# Patient Record
Sex: Female | Born: 1970
Health system: Southern US, Community
[De-identification: ages and names within clinical notes are randomized; demographics above are authoritative.]

## PROBLEM LIST (undated history)

## (undated) DIAGNOSIS — R51 Headache: Secondary | ICD-10-CM

## (undated) DIAGNOSIS — T4145XA Adverse effect of unspecified anesthetic, initial encounter: Secondary | ICD-10-CM

## (undated) DIAGNOSIS — T8859XA Other complications of anesthesia, initial encounter: Secondary | ICD-10-CM

## (undated) DIAGNOSIS — F419 Anxiety disorder, unspecified: Secondary | ICD-10-CM

## (undated) DIAGNOSIS — F329 Major depressive disorder, single episode, unspecified: Secondary | ICD-10-CM

## (undated) DIAGNOSIS — F988 Other specified behavioral and emotional disorders with onset usually occurring in childhood and adolescence: Secondary | ICD-10-CM

## (undated) DIAGNOSIS — F32A Depression, unspecified: Secondary | ICD-10-CM

## (undated) DIAGNOSIS — I878 Other specified disorders of veins: Secondary | ICD-10-CM

## (undated) HISTORY — PX: WISDOM TOOTH EXTRACTION: SHX21

## (undated) HISTORY — PX: HERNIA REPAIR: SHX51

## (undated) HISTORY — DX: Depression, unspecified: F32.A

## (undated) HISTORY — DX: Major depressive disorder, single episode, unspecified: F32.9

## (undated) HISTORY — DX: Anxiety disorder, unspecified: F41.9

## (undated) HISTORY — PX: TONSILLECTOMY: SUR1361

---
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
## 1998-10-27 ENCOUNTER — Other Ambulatory Visit: Admission: RE | Admit: 1998-10-27 | Discharge: 1998-10-27 | Payer: Self-pay | Admitting: Obstetrics & Gynecology

## 2000-01-13 ENCOUNTER — Other Ambulatory Visit: Admission: RE | Admit: 2000-01-13 | Discharge: 2000-01-13 | Payer: Self-pay | Admitting: Obstetrics & Gynecology

## 2000-07-13 ENCOUNTER — Ambulatory Visit (HOSPITAL_COMMUNITY): Admission: RE | Admit: 2000-07-13 | Discharge: 2000-07-13 | Payer: Self-pay | Admitting: Internal Medicine

## 2000-07-13 ENCOUNTER — Encounter: Payer: Self-pay | Admitting: Internal Medicine

## 2003-02-22 ENCOUNTER — Other Ambulatory Visit: Admission: RE | Admit: 2003-02-22 | Discharge: 2003-02-22 | Payer: Self-pay | Admitting: Obstetrics and Gynecology

## 2003-05-13 ENCOUNTER — Encounter: Admission: RE | Admit: 2003-05-13 | Discharge: 2003-05-13 | Payer: Self-pay | Admitting: Internal Medicine

## 2004-03-09 ENCOUNTER — Other Ambulatory Visit: Admission: RE | Admit: 2004-03-09 | Discharge: 2004-03-09 | Payer: Self-pay | Admitting: Obstetrics and Gynecology

## 2006-09-22 ENCOUNTER — Inpatient Hospital Stay (HOSPITAL_COMMUNITY): Admission: RE | Admit: 2006-09-22 | Discharge: 2006-09-24 | Payer: Self-pay | Admitting: Obstetrics and Gynecology

## 2007-12-27 ENCOUNTER — Ambulatory Visit: Payer: Self-pay | Admitting: Family Medicine

## 2008-02-19 ENCOUNTER — Ambulatory Visit: Payer: Self-pay | Admitting: Family Medicine

## 2008-03-04 ENCOUNTER — Ambulatory Visit: Payer: Self-pay | Admitting: Family Medicine

## 2008-12-17 ENCOUNTER — Encounter: Admission: RE | Admit: 2008-12-17 | Discharge: 2008-12-17 | Payer: Self-pay | Admitting: Obstetrics & Gynecology

## 2008-12-26 ENCOUNTER — Inpatient Hospital Stay (HOSPITAL_COMMUNITY): Admission: AD | Admit: 2008-12-26 | Discharge: 2008-12-27 | Payer: Self-pay | Admitting: Obstetrics & Gynecology

## 2009-02-23 ENCOUNTER — Inpatient Hospital Stay (HOSPITAL_COMMUNITY): Admission: AD | Admit: 2009-02-23 | Discharge: 2009-02-26 | Payer: Self-pay | Admitting: Obstetrics & Gynecology

## 2009-03-05 ENCOUNTER — Ambulatory Visit: Payer: Self-pay | Admitting: Family Medicine

## 2009-03-31 ENCOUNTER — Encounter: Payer: Self-pay | Admitting: Cardiovascular Disease

## 2009-04-11 DIAGNOSIS — M79609 Pain in unspecified limb: Secondary | ICD-10-CM | POA: Insufficient documentation

## 2009-04-14 ENCOUNTER — Ambulatory Visit: Payer: Self-pay | Admitting: Cardiovascular Disease

## 2009-04-14 DIAGNOSIS — R0602 Shortness of breath: Secondary | ICD-10-CM

## 2009-04-30 ENCOUNTER — Encounter: Payer: Self-pay | Admitting: Cardiovascular Disease

## 2009-04-30 ENCOUNTER — Ambulatory Visit: Payer: Self-pay

## 2009-04-30 ENCOUNTER — Ambulatory Visit (HOSPITAL_COMMUNITY): Admission: RE | Admit: 2009-04-30 | Discharge: 2009-04-30 | Payer: Self-pay | Admitting: Cardiovascular Disease

## 2009-04-30 ENCOUNTER — Ambulatory Visit: Payer: Self-pay | Admitting: Cardiology

## 2009-06-24 ENCOUNTER — Ambulatory Visit: Payer: Self-pay | Admitting: Family Medicine

## 2010-01-03 ENCOUNTER — Ambulatory Visit: Payer: Self-pay | Admitting: Internal Medicine

## 2010-01-28 ENCOUNTER — Encounter (INDEPENDENT_AMBULATORY_CARE_PROVIDER_SITE_OTHER): Payer: Self-pay | Admitting: Family Medicine

## 2010-01-28 LAB — CONVERTED CEMR LAB
ALT: 18 units/L (ref 0–35)
AST: 19 units/L (ref 0–37)
Albumin: 4.5 g/dL (ref 3.5–5.2)
Alkaline Phosphatase: 50 units/L (ref 39–117)
BUN: 12 mg/dL (ref 6–23)
CO2: 23 meq/L (ref 19–32)
Calcium: 9.6 mg/dL (ref 8.4–10.5)
Chloride: 105 meq/L (ref 96–112)
Cholesterol: 174 mg/dL (ref 0–200)
Creatinine, Ser: 0.74 mg/dL (ref 0.40–1.20)
Glucose, Bld: 93 mg/dL (ref 70–99)
HDL: 58 mg/dL (ref >39)
LDL Cholesterol: 96 mg/dL (ref 0–99)
Potassium: 4.4 meq/L (ref 3.5–5.3)
Sodium: 140 meq/L (ref 135–145)
Total Bilirubin: 0.2 mg/dL — ABNORMAL LOW (ref 0.3–1.2)
Total CHOL/HDL Ratio: 3
Total Protein: 6.9 g/dL (ref 6.0–8.3)
Triglycerides: 102 mg/dL (ref <150)
VLDL: 20 mg/dL (ref 0–40)

## 2010-06-11 NOTE — Letter (Signed)
Summary: [facility]  [facility]   Imported By: Kassie Mends 07/18/2009 10:47:42  _____________________________________________________________________  External Attachment:    Type:   Image     Comment:   External Document

## 2010-08-13 LAB — CBC
HCT: 34 % — ABNORMAL LOW (ref 36.0–46.0)
HCT: 38.1 % (ref 36.0–46.0)
Hemoglobin: 11.3 g/dL — ABNORMAL LOW (ref 12.0–15.0)
Hemoglobin: 12.7 g/dL (ref 12.0–15.0)
MCHC: 33.3 g/dL (ref 30.0–36.0)
MCHC: 33.4 g/dL (ref 30.0–36.0)
MCV: 90.3 fL (ref 78.0–100.0)
MCV: 91.6 fL (ref 78.0–100.0)
Platelets: 204 10*3/uL (ref 150–400)
Platelets: 228 10*3/uL (ref 150–400)
RBC: 3.71 MIL/uL — ABNORMAL LOW (ref 3.87–5.11)
RBC: 4.21 MIL/uL (ref 3.87–5.11)
RDW: 14.9 % (ref 11.5–15.5)
RDW: 15.1 % (ref 11.5–15.5)
WBC: 8.7 10*3/uL (ref 4.0–10.5)
WBC: 9.2 10*3/uL (ref 4.0–10.5)

## 2010-08-13 LAB — RPR: RPR Ser Ql: NONREACTIVE

## 2010-08-15 LAB — COMPREHENSIVE METABOLIC PANEL
ALT: 22 U/L (ref 0–35)
Alkaline Phosphatase: 62 U/L (ref 39–117)
BUN: 5 mg/dL — ABNORMAL LOW (ref 6–23)
CO2: 21 mEq/L (ref 19–32)
Chloride: 108 mEq/L (ref 96–112)
Glucose, Bld: 84 mg/dL (ref 70–99)
Potassium: 4.6 mEq/L (ref 3.5–5.1)
Sodium: 137 mEq/L (ref 135–145)
Total Bilirubin: 0.7 mg/dL (ref 0.3–1.2)
Total Protein: 6.1 g/dL (ref 6.0–8.3)

## 2010-08-15 LAB — URINE CULTURE

## 2010-08-15 LAB — CBC
HCT: 37.9 % (ref 36.0–46.0)
Hemoglobin: 12.6 g/dL (ref 12.0–15.0)
RBC: 4.16 MIL/uL (ref 3.87–5.11)
RDW: 15.5 % (ref 11.5–15.5)
WBC: 7.3 10*3/uL (ref 4.0–10.5)

## 2010-08-15 LAB — URINALYSIS, ROUTINE W REFLEX MICROSCOPIC
Glucose, UA: NEGATIVE mg/dL
Hgb urine dipstick: NEGATIVE
Ketones, ur: >80 mg/dL — AB
Protein, ur: NEGATIVE mg/dL
pH: 6 (ref 5.0–8.0)

## 2010-08-15 LAB — URINE MICROSCOPIC-ADD ON: RBC / HPF: NONE SEEN RBC/hpf (ref <3)

## 2010-09-25 NOTE — Discharge Summary (Signed)
NAME:  Cindy Deleon, Cindy Deleon                 ACCOUNT NO.:  [id number]   MEDICAL RECORD NO.:  [id number]          PATIENT TYPE:  INP   LOCATION:  9122                          FACILITY:  [facility]   PHYSICIAN:  Gerrit Friends. Aldona Bar, M.D.   DATE OF BIRTH:  1971-01-15   DATE OF ADMISSION:  09/22/2006  DATE OF DISCHARGE:                               DISCHARGE SUMMARY   DISCHARGE DIAGNOSES:  1. Term pregnancy, delivered 8-pound 10-ounce female infant, Apgars 9      and 9.  2. Blood type A+.   PROCEDURES:  1. Normal spontaneous delivery.  2. Second-degree tear and repair.   SUMMARY:  This 40 year old, gravida 2, para 1 was admitted at 78 to 76  weeks' gestation with spontaneous rupture of membranes.  During the  latter part of her pregnancy, she had significant lower extremity edema  but normal blood pressures and normal labs.   At the time of admission, she was about 2 cm dilated, 70% effaced with  vertex at -2 station.  She was admitted.  Labor was augmented with  Pitocin.  She eventually requested and received an epidural, and she  progressed and had a normal spontaneous delivery of an 8-pound 10-ounce  female infant with Apgars of 9 and 9 over a second-degree tear which was  repaired without difficulty.  Postpartum course was uncomplicated.  Her  edema was slightly improved at the time of discharge.  Her discharge  hemoglobin was 9.8 with a white count 10,200 and a platelet count of  136,000.   On the morning of May 17, she was ambulating well, tolerating a regular  diet well, having normal bowel and bladder function.  Vital signs were  stable.  She was bottle feeding without difficulty and was desirous of  discharge.  As mentioned, she still had significant lower extremity  edema, but her blood pressure was normal.   Discharge medications include hydrochlorothiazide 25 mg in the morning  for the next 3 or 4 days or as needed for fluid retention, Motrin 600 mg  every 6 hours as needed for cramping  or pain, and Tylox 1 to 2 every 4  to 6 hours as needed for more severe pain.  She will also use Feosol  capsules - 1 a day - until her return to the office in approximately  four weeks' time.  She will finish up her prenatal vitamins.   She was given all appropriate instructions per discharge brochure at the  time of discharge and understood all instructions well.   CONDITION ON DISCHARGE:  Improved.     Gerrit Friends. Aldona Bar, M.D.  Electronically Signed    RMW/MEDQ  D:  09/24/2006  T:  09/24/2006  Job:  [id number]

## 2010-10-03 ENCOUNTER — Inpatient Hospital Stay (INDEPENDENT_AMBULATORY_CARE_PROVIDER_SITE_OTHER)
Admission: RE | Admit: 2010-10-03 | Discharge: 2010-10-03 | Disposition: A | Discharge status: Home / Self Care | Payer: Self-pay | Source: Ambulatory Visit | Attending: Family Medicine | Admitting: Family Medicine

## 2010-10-03 DIAGNOSIS — F411 Generalized anxiety disorder: Secondary | ICD-10-CM

## 2010-10-03 DIAGNOSIS — J019 Acute sinusitis, unspecified: Secondary | ICD-10-CM

## 2010-10-03 DIAGNOSIS — F329 Major depressive disorder, single episode, unspecified: Secondary | ICD-10-CM

## 2010-10-03 DIAGNOSIS — R229 Localized swelling, mass and lump, unspecified: Secondary | ICD-10-CM

## 2010-10-10 ENCOUNTER — Emergency Department (HOSPITAL_COMMUNITY)
Admission: EM | Admit: 2010-10-10 | Discharge: 2010-10-11 | Disposition: A | Discharge status: Home / Self Care | Payer: Self-pay | Attending: Emergency Medicine | Admitting: Emergency Medicine

## 2010-10-10 DIAGNOSIS — F3289 Other specified depressive episodes: Secondary | ICD-10-CM | POA: Insufficient documentation

## 2010-10-10 DIAGNOSIS — R42 Dizziness and giddiness: Secondary | ICD-10-CM | POA: Insufficient documentation

## 2010-10-10 DIAGNOSIS — R110 Nausea: Secondary | ICD-10-CM | POA: Insufficient documentation

## 2010-10-10 DIAGNOSIS — G43909 Migraine, unspecified, not intractable, without status migrainosus: Secondary | ICD-10-CM | POA: Insufficient documentation

## 2010-10-10 DIAGNOSIS — M129 Arthropathy, unspecified: Secondary | ICD-10-CM | POA: Insufficient documentation

## 2010-10-10 DIAGNOSIS — H5710 Ocular pain, unspecified eye: Secondary | ICD-10-CM | POA: Insufficient documentation

## 2010-10-10 DIAGNOSIS — F329 Major depressive disorder, single episode, unspecified: Secondary | ICD-10-CM | POA: Insufficient documentation

## 2010-10-10 DIAGNOSIS — H53149 Visual discomfort, unspecified: Secondary | ICD-10-CM | POA: Insufficient documentation

## 2010-10-11 ENCOUNTER — Emergency Department (HOSPITAL_COMMUNITY): Payer: Self-pay

## 2011-03-08 ENCOUNTER — Encounter: Payer: Self-pay | Admitting: Family Medicine

## 2013-03-26 ENCOUNTER — Encounter (INDEPENDENT_AMBULATORY_CARE_PROVIDER_SITE_OTHER): Payer: Self-pay | Admitting: Surgery

## 2013-03-26 ENCOUNTER — Ambulatory Visit (INDEPENDENT_AMBULATORY_CARE_PROVIDER_SITE_OTHER): Payer: Self-pay | Admitting: Surgery

## 2013-03-26 ENCOUNTER — Telehealth (INDEPENDENT_AMBULATORY_CARE_PROVIDER_SITE_OTHER): Payer: Self-pay | Admitting: Surgery

## 2013-03-26 VITALS — BP 118/70 | HR 96 | Temp 98.1°F | Resp 15 | Ht 67.0 in | Wt 135.8 lb

## 2013-03-26 DIAGNOSIS — K4090 Unilateral inguinal hernia, without obstruction or gangrene, not specified as recurrent: Secondary | ICD-10-CM | POA: Insufficient documentation

## 2013-03-26 DIAGNOSIS — R109 Unspecified abdominal pain: Secondary | ICD-10-CM | POA: Insufficient documentation

## 2013-03-26 NOTE — Progress Notes (Signed)
Patient ID: Cindy Deleon, female   DOB: 04/23/1971, 42 y.o.   MRN: [id number]  Chief Complaint  Patient presents with  . New Evaluation    eval left ing hernia    HPI Cindy Deleon is Deleon 42 y.o. female.  Patient presents at request of Dr.Wein for left inguinal hernia. The patient has noticed Deleon bulge at least the last year. She also complains of progressive abdominal distention, vague discomfort and change in bowel function becoming more constipated. No blood in her stool. Family history of breast and colon cancer. She states she feels more bloated and that her abdomen is become larger. The hernias more noticeable her left groin she stands up or strains. It slides in slides out easily. It causes mild to moderate discomfort. HPI  Past Medical History  Diagnosis Date  . Depression   . Anxiety     Past Surgical History  Procedure Laterality Date  . Tonsillectomy      Family History  Problem Relation Age of Onset  . Cancer Maternal Grandmother     breast  . Cancer Paternal Grandfather     colon and lung    Social History History  Substance Use Topics  . Smoking status: Current Some Day Smoker -- 0.25 packs/day  . Smokeless tobacco: Never Used  . Alcohol Use: No    Allergies  Allergen Reactions  . Sulfonamide Derivatives     Current Outpatient Prescriptions  Medication Sig Dispense Refill  . amphetamine-dextroamphetamine (ADDERALL XR) 10 MG 24 hr capsule Take 10 mg by mouth daily.      . clonazePAM (KLONOPIN) 0.5 MG tablet Take 0.5 mg by mouth 2 (two) times daily as needed for anxiety.      . ibuprofen (ADVIL,MOTRIN) 800 MG tablet Take 800 mg by mouth every 8 (eight) hours as needed.      . rizatriptan (MAXALT) 10 MG tablet Take 10 mg by mouth as needed for migraine. May repeat in 2 hours if needed      . sertraline (ZOLOFT) 100 MG tablet Take 150 mg by mouth daily.       No current facility-administered medications for this visit.    Review of Systems Review of  Systems  Constitutional: Positive for fatigue. Negative for fever and unexpected weight change.  Eyes: Negative.   Respiratory: Negative.   Cardiovascular: Negative.   Gastrointestinal: Positive for abdominal pain, constipation and abdominal distention.  Endocrine: Negative.   Genitourinary: Negative.   Allergic/Immunologic: Negative.   Neurological: Negative.   Hematological: Negative.   Psychiatric/Behavioral: Negative.     Blood pressure 118/70, pulse 96, temperature 98.1 F (36.7 C), temperature source Temporal, resp. rate 15, height 5' 7" (1.702 m), weight 135 lb 12.8 oz (61.598 kg).  Physical Exam Physical Exam  Constitutional: She is oriented to person, place, and time. She appears well-developed and well-nourished.  HENT:  Head: Normocephalic and atraumatic.  Eyes: EOM are normal. Pupils are equal, round, and reactive to light.  Neck: Normal range of motion. Neck supple.  Cardiovascular: Normal rate.   Pulmonary/Chest: Effort normal.  Abdominal: Soft. She exhibits distension. She exhibits no mass. There is no tenderness. There is no rebound and no guarding. Deleon hernia is present. Hernia confirmed positive in the left inguinal area.  Musculoskeletal: Normal range of motion.  Neurological: She is alert and oriented to person, place, and time.  Skin: Skin is warm.  Psychiatric: She has Deleon normal mood and affect. Her behavior is normal. Judgment and thought   content normal.    Data Reviewed none  Assessment    Left inguinal hernia reducible  Abdominal pain and bloating with change in bowel function    Plan    Recommend CT scan abdomen pelvis to evaluate progressive abdominal pain and bloating. Recommend repair of left inguinal hernia with mesh.The risk of hernia repair include bleeding,  Infection,   Recurrence of the hernia,  Mesh use, chronic pain,  Organ injury,  Bowel injury,  Bladder injury,   nerve injury with numbness around the incision,  Death,  and worsening of  preexisting  medical problems.  The alternatives to surgery have been discussed as well..  Long term expectations of both operative and non operative treatments have been discussed.   The patient agrees to proceed.       Cindy Deleon. 03/26/2013, 12:30 PM    

## 2013-03-26 NOTE — Telephone Encounter (Signed)
Patient met with surgery scheduling, gave financial responsibilities, patient will call back to schedule, face placed in pending

## 2013-03-26 NOTE — Patient Instructions (Signed)

## 2013-03-27 ENCOUNTER — Ambulatory Visit
Admission: RE | Admit: 2013-03-27 | Discharge: 2013-03-27 | Disposition: A | Discharge status: Home / Self Care | Payer: Self-pay | Source: Ambulatory Visit | Attending: Surgery | Admitting: Surgery

## 2013-03-27 DIAGNOSIS — R109 Unspecified abdominal pain: Secondary | ICD-10-CM

## 2013-03-27 MED ORDER — IOHEXOL 300 MG/ML  SOLN
100.0000 mL | Freq: Once | INTRAMUSCULAR | Status: AC | PRN
  Administered 2013-03-27: 100 mL via INTRAVENOUS

## 2013-04-03 ENCOUNTER — Telehealth (INDEPENDENT_AMBULATORY_CARE_PROVIDER_SITE_OTHER): Payer: Self-pay

## 2013-04-03 NOTE — Telephone Encounter (Signed)
The pt called stating she has surgery scheduled for 12/4.  She is unemployed and has had job interviews.  She has a job offer and they want her to start on 12/8.  She wants to know if this would be possible.   She also wants her CT results.   Please call

## 2013-04-04 NOTE — Telephone Encounter (Signed)
Called pt back and went over her CT results with her. I went over high fiber diet for her to follow.  I also explained starting a new job 4 days after surgery was not likely possible. Explained she would be restricted from lifting for 4-6 weeks after surgery. Pt understood information given to her.

## 2013-04-09 ENCOUNTER — Encounter (HOSPITAL_BASED_OUTPATIENT_CLINIC_OR_DEPARTMENT_OTHER): Payer: Self-pay | Admitting: *Deleted

## 2013-04-09 NOTE — Progress Notes (Signed)
To come in for [facility] labs- 

## 2013-04-10 ENCOUNTER — Encounter (HOSPITAL_BASED_OUTPATIENT_CLINIC_OR_DEPARTMENT_OTHER)
Admission: RE | Admit: 2013-04-10 | Discharge: 2013-04-10 | Disposition: A | Discharge status: Home / Self Care | Payer: Self-pay | Source: Ambulatory Visit | Attending: Surgery | Admitting: Surgery

## 2013-04-10 DIAGNOSIS — Z01810 Encounter for preprocedural cardiovascular examination: Secondary | ICD-10-CM | POA: Insufficient documentation

## 2013-04-10 DIAGNOSIS — Z01818 Encounter for other preprocedural examination: Secondary | ICD-10-CM | POA: Insufficient documentation

## 2013-04-10 LAB — COMPREHENSIVE METABOLIC PANEL
ALT: 24 U/L (ref 0–35)
BUN: 11 mg/dL (ref 6–23)
CO2: 30 mEq/L (ref 19–32)
Calcium: 9.9 mg/dL (ref 8.4–10.5)
GFR calc Af Amer: >90 mL/min (ref >90)
GFR calc non Af Amer: >90 mL/min (ref >90)
Glucose, Bld: 76 mg/dL (ref 70–99)
Potassium: 4.1 mEq/L (ref 3.5–5.1)
Sodium: 140 mEq/L (ref 135–145)
Total Protein: 7.7 g/dL (ref 6.0–8.3)

## 2013-04-10 LAB — CBC WITH DIFFERENTIAL/PLATELET
Basophils Relative: 0 % (ref 0–1)
Eosinophils Absolute: 0 10*3/uL (ref 0.0–0.7)
Eosinophils Relative: 1 % (ref 0–5)
Lymphs Abs: 1.8 10*3/uL (ref 0.7–4.0)
MCH: 31.2 pg (ref 26.0–34.0)
MCHC: 34.9 g/dL (ref 30.0–36.0)
MCV: 89.4 fL (ref 78.0–100.0)
Neutro Abs: 5.5 10*3/uL (ref 1.7–7.7)
Neutrophils Relative %: 72 % (ref 43–77)
Platelets: 185 10*3/uL (ref 150–400)
RBC: 4.81 MIL/uL (ref 3.87–5.11)

## 2013-04-12 ENCOUNTER — Ambulatory Visit (HOSPITAL_BASED_OUTPATIENT_CLINIC_OR_DEPARTMENT_OTHER): Payer: Self-pay | Admitting: *Deleted

## 2013-04-12 ENCOUNTER — Encounter (HOSPITAL_BASED_OUTPATIENT_CLINIC_OR_DEPARTMENT_OTHER): Payer: Self-pay | Admitting: *Deleted

## 2013-04-12 ENCOUNTER — Ambulatory Visit (HOSPITAL_BASED_OUTPATIENT_CLINIC_OR_DEPARTMENT_OTHER)
Admission: RE | Admit: 2013-04-12 | Discharge: 2013-04-12 | Disposition: A | Discharge status: Home / Self Care | Payer: Self-pay | Source: Ambulatory Visit | Attending: Surgery | Admitting: Surgery

## 2013-04-12 ENCOUNTER — Encounter (HOSPITAL_BASED_OUTPATIENT_CLINIC_OR_DEPARTMENT_OTHER)
Admission: RE | Disposition: A | Discharge status: Home / Self Care | Payer: Self-pay | Source: Ambulatory Visit | Attending: Surgery

## 2013-04-12 DIAGNOSIS — M79609 Pain in unspecified limb: Secondary | ICD-10-CM

## 2013-04-12 DIAGNOSIS — F17200 Nicotine dependence, unspecified, uncomplicated: Secondary | ICD-10-CM | POA: Insufficient documentation

## 2013-04-12 DIAGNOSIS — K4090 Unilateral inguinal hernia, without obstruction or gangrene, not specified as recurrent: Secondary | ICD-10-CM

## 2013-04-12 DIAGNOSIS — Z79899 Other long term (current) drug therapy: Secondary | ICD-10-CM | POA: Insufficient documentation

## 2013-04-12 DIAGNOSIS — F329 Major depressive disorder, single episode, unspecified: Secondary | ICD-10-CM | POA: Insufficient documentation

## 2013-04-12 DIAGNOSIS — F411 Generalized anxiety disorder: Secondary | ICD-10-CM | POA: Insufficient documentation

## 2013-04-12 DIAGNOSIS — F3289 Other specified depressive episodes: Secondary | ICD-10-CM | POA: Insufficient documentation

## 2013-04-12 DIAGNOSIS — R0602 Shortness of breath: Secondary | ICD-10-CM

## 2013-04-12 DIAGNOSIS — Z01812 Encounter for preprocedural laboratory examination: Secondary | ICD-10-CM | POA: Insufficient documentation

## 2013-04-12 DIAGNOSIS — R109 Unspecified abdominal pain: Secondary | ICD-10-CM

## 2013-04-12 HISTORY — PX: INGUINAL HERNIA REPAIR: SHX194

## 2013-04-12 HISTORY — DX: Other specified behavioral and emotional disorders with onset usually occurring in childhood and adolescence: F98.8

## 2013-04-12 HISTORY — DX: Headache: R51

## 2013-04-12 HISTORY — DX: Other specified disorders of veins: I87.8

## 2013-04-12 SURGERY — REPAIR, HERNIA, INGUINAL, ADULT
Anesthesia: General | Site: Abdomen | Laterality: Left

## 2013-04-12 MED ORDER — SUCCINYLCHOLINE CHLORIDE 20 MG/ML IJ SOLN
INTRAMUSCULAR | Status: DC | PRN
  Administered 2013-04-12: 100 mg via INTRAVENOUS

## 2013-04-12 MED ORDER — BUPIVACAINE-EPINEPHRINE 0.25% -1:200000 IJ SOLN
INTRAMUSCULAR | Status: DC | PRN
  Administered 2013-04-12: 30 mL

## 2013-04-12 MED ORDER — OXYCODONE HCL 5 MG PO TABS: 5.0000 mg | ORAL_TABLET | Freq: Once | ORAL | Status: DC | PRN

## 2013-04-12 MED ORDER — OXYCODONE-ACETAMINOPHEN 5-325 MG PO TABS: 1.0000 | ORAL_TABLET | ORAL | Status: DC | PRN

## 2013-04-12 MED ORDER — TRAMADOL HCL 50 MG PO TABS: 100.0000 mg | ORAL_TABLET | Freq: Four times a day (QID) | ORAL | Status: DC | PRN

## 2013-04-12 MED ORDER — LACTATED RINGERS IV SOLN
INTRAVENOUS | Status: DC
  Administered 2013-04-12 (×2): via INTRAVENOUS

## 2013-04-12 MED ORDER — LIDOCAINE HCL (CARDIAC) 20 MG/ML IV SOLN
INTRAVENOUS | Status: DC | PRN
  Administered 2013-04-12: 100 mg via INTRAVENOUS

## 2013-04-12 MED ORDER — PROPOFOL 10 MG/ML IV EMUL
INTRAVENOUS | Status: AC
  Filled 2013-04-12: qty 50

## 2013-04-12 MED ORDER — BUPIVACAINE-EPINEPHRINE PF 0.25-1:200000 % IJ SOLN
INTRAMUSCULAR | Status: AC
  Filled 2013-04-12: qty 30

## 2013-04-12 MED ORDER — MIDAZOLAM HCL 5 MG/5ML IJ SOLN
INTRAMUSCULAR | Status: DC | PRN
  Administered 2013-04-12: 2 mg via INTRAVENOUS

## 2013-04-12 MED ORDER — DEXTROSE 5 % IV SOLN
3.0000 g | INTRAVENOUS | Status: AC
  Administered 2013-04-12: 2 g via INTRAVENOUS

## 2013-04-12 MED ORDER — FENTANYL CITRATE 0.05 MG/ML IJ SOLN: 50.0000 ug | INTRAMUSCULAR | Status: DC | PRN

## 2013-04-12 MED ORDER — MIDAZOLAM HCL 2 MG/2ML IJ SOLN
INTRAMUSCULAR | Status: AC
  Filled 2013-04-12: qty 2

## 2013-04-12 MED ORDER — SUCCINYLCHOLINE CHLORIDE 20 MG/ML IJ SOLN
INTRAMUSCULAR | Status: AC
  Filled 2013-04-12: qty 2

## 2013-04-12 MED ORDER — CHLORHEXIDINE GLUCONATE 4 % EX LIQD: 1.0000 "application " | Freq: Once | CUTANEOUS | Status: DC

## 2013-04-12 MED ORDER — DEXTROSE 5 % IV SOLN
10.0000 mg | INTRAVENOUS | Status: DC | PRN
  Administered 2013-04-12: 50 ug/min via INTRAVENOUS

## 2013-04-12 MED ORDER — PROPOFOL 10 MG/ML IV BOLUS
INTRAVENOUS | Status: DC | PRN
  Administered 2013-04-12: 200 mg via INTRAVENOUS

## 2013-04-12 MED ORDER — EPHEDRINE SULFATE 50 MG/ML IJ SOLN
INTRAMUSCULAR | Status: DC | PRN
  Administered 2013-04-12: 10 mg via INTRAVENOUS
  Administered 2013-04-12 (×2): 5 mg via INTRAVENOUS

## 2013-04-12 MED ORDER — ONDANSETRON HCL 4 MG/2ML IJ SOLN
INTRAMUSCULAR | Status: DC | PRN
  Administered 2013-04-12: 4 mg via INTRAVENOUS

## 2013-04-12 MED ORDER — DEXAMETHASONE SODIUM PHOSPHATE 4 MG/ML IJ SOLN
INTRAMUSCULAR | Status: DC | PRN
  Administered 2013-04-12: 10 mg via INTRAVENOUS

## 2013-04-12 MED ORDER — MIDAZOLAM HCL 2 MG/2ML IJ SOLN: 1.0000 mg | INTRAMUSCULAR | Status: DC | PRN

## 2013-04-12 MED ORDER — HYDROMORPHONE HCL PF 1 MG/ML IJ SOLN: 0.2500 mg | INTRAMUSCULAR | Status: DC | PRN

## 2013-04-12 MED ORDER — ONDANSETRON HCL 4 MG/2ML IJ SOLN: 4.0000 mg | Freq: Once | INTRAMUSCULAR | Status: DC | PRN

## 2013-04-12 MED ORDER — FENTANYL CITRATE 0.05 MG/ML IJ SOLN
INTRAMUSCULAR | Status: DC | PRN
  Administered 2013-04-12: 100 ug via INTRAVENOUS

## 2013-04-12 MED ORDER — FENTANYL CITRATE 0.05 MG/ML IJ SOLN
INTRAMUSCULAR | Status: AC
  Filled 2013-04-12: qty 4

## 2013-04-12 MED ORDER — OXYCODONE HCL 5 MG/5ML PO SOLN
5.0000 mg | Freq: Once | ORAL | Status: DC | PRN
Start: 2013-04-12 — End: 2013-04-12

## 2013-04-12 SURGICAL SUPPLY — 49 items
BLADE SURG 15 STRL LF DISP TIS (BLADE) ×1 IMPLANT
BLADE SURG 15 STRL SS (BLADE) ×1
BLADE SURG ROTATE 9660 (MISCELLANEOUS) ×2 IMPLANT
CANISTER SUCT 1200ML W/VALVE (MISCELLANEOUS) ×2 IMPLANT
CHLORAPREP W/TINT 26ML (MISCELLANEOUS) ×2 IMPLANT
COVER MAYO STAND STRL (DRAPES) ×2 IMPLANT
COVER TABLE BACK 60X90 (DRAPES) ×2 IMPLANT
DECANTER SPIKE VIAL GLASS SM (MISCELLANEOUS) ×2 IMPLANT
DERMABOND ADVANCED (GAUZE/BANDAGES/DRESSINGS) ×1
DERMABOND ADVANCED .7 DNX12 (GAUZE/BANDAGES/DRESSINGS) ×1 IMPLANT
DRAIN PENROSE 1/2X12 LTX STRL (WOUND CARE) ×2 IMPLANT
DRAPE LAPAROTOMY TRNSV 102X78 (DRAPE) ×2 IMPLANT
DRAPE UTILITY XL STRL (DRAPES) ×2 IMPLANT
ELECT COATED BLADE 2.86 ST (ELECTRODE) ×2 IMPLANT
ELECT REM PT RETURN 9FT ADLT (ELECTROSURGICAL) ×2
ELECTRODE REM PT RTRN 9FT ADLT (ELECTROSURGICAL) ×1 IMPLANT
GAUZE SPONGE 4X4 12PLY STRL LF (GAUZE/BANDAGES/DRESSINGS) IMPLANT
GAUZE SPONGE 4X4 16PLY XRAY LF (GAUZE/BANDAGES/DRESSINGS) IMPLANT
GLOVE BIOGEL M STRL SZ7.5 (GLOVE) ×2 IMPLANT
GLOVE BIOGEL PI IND STRL 8 (GLOVE) ×2 IMPLANT
GLOVE BIOGEL PI INDICATOR 8 (GLOVE) ×2
GLOVE ECLIPSE 8.0 STRL XLNG CF (GLOVE) ×2 IMPLANT
GLOVE EXAM NITRILE LRG STRL (GLOVE) ×2 IMPLANT
GOWN PREVENTION PLUS XLARGE (GOWN DISPOSABLE) ×2 IMPLANT
GOWN STRL REIN 2XL LVL4 (GOWN DISPOSABLE) ×2 IMPLANT
MESH HERNIA SYS ULTRAPRO LRG (Mesh General) ×2 IMPLANT
NEEDLE HYPO 25X1 1.5 SAFETY (NEEDLE) ×2 IMPLANT
NS IRRIG 1000ML POUR BTL (IV SOLUTION) ×2 IMPLANT
PACK BASIN DAY SURGERY FS (CUSTOM PROCEDURE TRAY) ×2 IMPLANT
PENCIL BUTTON HOLSTER BLD 10FT (ELECTRODE) ×2 IMPLANT
SLEEVE SCD COMPRESS KNEE MED (MISCELLANEOUS) ×2 IMPLANT
SPONGE LAP 4X18 X RAY DECT (DISPOSABLE) ×2 IMPLANT
STAPLER VISISTAT 35W (STAPLE) IMPLANT
SUT MON AB 4-0 PC3 18 (SUTURE) ×2 IMPLANT
SUT NOVA 0 T19/GS 22DT (SUTURE) ×4 IMPLANT
SUT VIC AB 0 SH 27 (SUTURE) ×2 IMPLANT
SUT VIC AB 2-0 SH 27 (SUTURE) ×1
SUT VIC AB 2-0 SH 27XBRD (SUTURE) ×1 IMPLANT
SUT VIC AB 3-0 54X BRD REEL (SUTURE) IMPLANT
SUT VIC AB 3-0 BRD 54 (SUTURE)
SUT VICRYL 3-0 CR8 SH (SUTURE) ×2 IMPLANT
SUT VICRYL AB 2 0 TIE (SUTURE) IMPLANT
SUT VICRYL AB 2 0 TIES (SUTURE)
SYR CONTROL 10ML LL (SYRINGE) ×2 IMPLANT
TAPE HYPAFIX 4 X10 (GAUZE/BANDAGES/DRESSINGS) IMPLANT
TOWEL OR 17X24 6PK STRL BLUE (TOWEL DISPOSABLE) ×4 IMPLANT
TOWEL OR NON WOVEN STRL DISP B (DISPOSABLE) ×2 IMPLANT
TUBE CONNECTING 20X1/4 (TUBING) ×2 IMPLANT
YANKAUER SUCT BULB TIP NO VENT (SUCTIONS) ×2 IMPLANT

## 2013-04-12 NOTE — H&P (View-Only) (Signed)
Patient ID: Cindy Deleon, female   DOB: 09-Jun-1970, 42 y.o.   MRN: [id number]  Chief Complaint  Patient presents with  . New Evaluation    eval left ing hernia    HPI Cindy Deleon is a 42 y.o. female.  Patient presents at request of Dr.Wein for left inguinal hernia. The patient has noticed a bulge at least the last year. She also complains of progressive abdominal distention, vague discomfort and change in bowel function becoming more constipated. No blood in her stool. Family history of breast and colon cancer. She states she feels more bloated and that her abdomen is become larger. The hernias more noticeable her left groin she stands up or strains. It slides in slides out easily. It causes mild to moderate discomfort. HPI  Past Medical History  Diagnosis Date  . Depression   . Anxiety     Past Surgical History  Procedure Laterality Date  . Tonsillectomy      Family History  Problem Relation Age of Onset  . Cancer Maternal Grandmother     breast  . Cancer Paternal Grandfather     colon and lung    Social History History  Substance Use Topics  . Smoking status: Current Some Day Smoker -- 0.25 packs/day  . Smokeless tobacco: Never Used  . Alcohol Use: No    Allergies  Allergen Reactions  . Sulfonamide Derivatives     Current Outpatient Prescriptions  Medication Sig Dispense Refill  . amphetamine-dextroamphetamine (ADDERALL XR) 10 MG 24 hr capsule Take 10 mg by mouth daily.      . clonazePAM (KLONOPIN) 0.5 MG tablet Take 0.5 mg by mouth 2 (two) times daily as needed for anxiety.      Marland Kitchen ibuprofen (ADVIL,MOTRIN) 800 MG tablet Take 800 mg by mouth every 8 (eight) hours as needed.      . rizatriptan (MAXALT) 10 MG tablet Take 10 mg by mouth as needed for migraine. May repeat in 2 hours if needed      . sertraline (ZOLOFT) 100 MG tablet Take 150 mg by mouth daily.       No current facility-administered medications for this visit.    Review of Systems Review of  Systems  Constitutional: Positive for fatigue. Negative for fever and unexpected weight change.  Eyes: Negative.   Respiratory: Negative.   Cardiovascular: Negative.   Gastrointestinal: Positive for abdominal pain, constipation and abdominal distention.  Endocrine: Negative.   Genitourinary: Negative.   Allergic/Immunologic: Negative.   Neurological: Negative.   Hematological: Negative.   Psychiatric/Behavioral: Negative.     Blood pressure 118/70, pulse 96, temperature 98.1 F (36.7 C), temperature source Temporal, resp. rate 15, height 5\' 7"  (1.702 m), weight 135 lb 12.8 oz (61.598 kg).  Physical Exam Physical Exam  Constitutional: She is oriented to person, place, and time. She appears well-developed and well-nourished.  HENT:  Head: Normocephalic and atraumatic.  Eyes: EOM are normal. Pupils are equal, round, and reactive to light.  Neck: Normal range of motion. Neck supple.  Cardiovascular: Normal rate.   Pulmonary/Chest: Effort normal.  Abdominal: Soft. She exhibits distension. She exhibits no mass. There is no tenderness. There is no rebound and no guarding. A hernia is present. Hernia confirmed positive in the left inguinal area.  Musculoskeletal: Normal range of motion.  Neurological: She is alert and oriented to person, place, and time.  Skin: Skin is warm.  Psychiatric: She has a normal mood and affect. Her behavior is normal. Judgment and thought  content normal.    Data Reviewed none  Assessment    Left inguinal hernia reducible  Abdominal pain and bloating with change in bowel function    Plan    Recommend CT scan abdomen pelvis to evaluate progressive abdominal pain and bloating. Recommend repair of left inguinal hernia with mesh.The risk of hernia repair include bleeding,  Infection,   Recurrence of the hernia,  Mesh use, chronic pain,  Organ injury,  Bowel injury,  Bladder injury,   nerve injury with numbness around the incision,  Death,  and worsening of  preexisting  medical problems.  The alternatives to surgery have been discussed as well..  Long term expectations of both operative and non operative treatments have been discussed.   The patient agrees to proceed.       Adelae Yodice A. 03/26/2013, 12:30 PM

## 2013-04-12 NOTE — Anesthesia Procedure Notes (Signed)
Procedure Name: Intubation Date/Time: 04/12/2013 7:37 AM Performed by: Harriette Bouillon A. Pre-anesthesia Checklist: Patient identified, Emergency Drugs available, Suction available and Patient being monitored Patient Re-evaluated:Patient Re-evaluated prior to inductionOxygen Delivery Method: Circle System Utilized Preoxygenation: Pre-oxygenation with 100% oxygen Intubation Type: IV induction Ventilation: Mask ventilation without difficulty Laryngoscope Size: Miller and 2 Grade View: Grade I Tube type: Oral Tube size: 7.0 mm Number of attempts: 1 Airway Equipment and Method: stylet Placement Confirmation: ETT inserted through vocal cords under direct vision,  positive ETCO2 and breath sounds checked- equal and bilateral Secured at: 22 cm Tube secured with: Tape Dental Injury: Teeth and Oropharynx as per pre-operative assessment

## 2013-04-12 NOTE — Anesthesia Preprocedure Evaluation (Signed)
Anesthesia Evaluation  Patient identified by MRN, date of birth, ID band Patient awake    Reviewed: Allergy & Precautions, H&P , NPO status , Patient's Chart, lab work & pertinent test results  Airway Mallampati: I TM Distance: >3 FB Neck ROM: Full    Dental  (+) Teeth Intact and Dental Advisory Given   Pulmonary Current Smoker,  breath sounds clear to auscultation        Cardiovascular Rhythm:Regular Rate:Normal     Neuro/Psych    GI/Hepatic   Endo/Other    Renal/GU      Musculoskeletal   Abdominal   Peds  Hematology   Anesthesia Other Findings   Reproductive/Obstetrics                           Anesthesia Physical Anesthesia Plan  ASA: II  Anesthesia Plan: General   Post-op Pain Management:    Induction: Intravenous  Airway Management Planned: LMA  Additional Equipment:   Intra-op Plan:   Post-operative Plan: Extubation in OR  Informed Consent: I have reviewed the patients History and Physical, chart, labs and discussed the procedure including the risks, benefits and alternatives for the proposed anesthesia with the patient or authorized representative who has indicated his/her understanding and acceptance.   Dental advisory given  Plan Discussed with: CRNA, Anesthesiologist and Surgeon  Anesthesia Plan Comments:         Anesthesia Quick Evaluation  

## 2013-04-12 NOTE — Transfer of Care (Signed)
Immediate Anesthesia Transfer of Care Note  Patient: Cindy Deleon  Procedure(s) Performed: Procedure(s):  REPAIR LEFT INGUINAL HERNIA (Left)  Patient Location: PACU  Anesthesia Type:General  Level of Consciousness: awake, alert  and oriented  Airway & Oxygen Therapy: Patient Spontanous Breathing and Patient connected to face mask oxygen  Post-op Assessment: Report given to PACU RN, Post -op Vital signs reviewed and stable and Patient moving all extremities  Post vital signs: Reviewed and stable  Complications: No apparent anesthesia complications

## 2013-04-12 NOTE — Interval H&P Note (Signed)
History and Physical Interval Note:  04/12/2013 7:27 AM  Cindy Deleon  has presented today for surgery, with the diagnosis of left inguinal hernia  The various methods of treatment have been discussed with the patient and family. After consideration of risks, benefits and other options for treatment, the patient has consented to  Procedure(s):  REPAIR LEFT INGUINAL HERNIA (Left) as a surgical intervention .  The patient's history has been reviewed, patient examined, no change in status, stable for surgery.  I have reviewed the patient's chart and labs.  Questions were answered to the patient's satisfaction.     Harpreet Pompey A.

## 2013-04-12 NOTE — Op Note (Signed)
Left Inguinal Hernia, Open, Procedure Note with UHS mesh  Indications: The patient presented with a history of a right, reducible  Inguinal hernia.The risk of hernia repair include bleeding,  Infection,   Recurrence of the hernia,  Mesh use, chronic pain,  Organ injury,  Bowel injury,  Bladder injury,   nerve injury with numbness around the incision,  Death,  and worsening of preexisting  medical problems.  The alternatives to surgery have been discussed as well..  Long term expectations of both operative and non operative treatments have been discussed.   The patient agrees to proceed.    Pre-operative Diagnosis: left reducible inguinal hernia  Post-operative Diagnosis: same  Surgeon: Harriette Bouillon A.   Assistants: OR staff  Anesthesia: General endotracheal anesthesia and Local anesthesia 0.25.% bupivacaine, with epinephrine  ASA Class: 2  Procedure Details  The patient was seen again in the Holding Room. The risks, benefits, complications, treatment options, and expected outcomes were discussed with the patient. The possibilities of reaction to medication, pulmonary aspiration, perforation of viscus, bleeding, recurrent infection, the need for additional procedures, and development of a complication requiring transfusion or further operation were discussed with the patient and/or family. There was concurrence with the proposed plan, and informed consent was obtained. The site of surgery was properly noted/marked. The patient was taken to the Operating Room, identified as Cindy Deleon, and the procedure verified as hernia repair. A Time Out was held and the above information confirmed.  The patient was placed in the supine position and underwent induction of anesthesia, the lower abdomen and groin was prepped and draped in the standard fashion, and 0.25% Marcaine with epinephrine was used to anesthetize the skin over the mid-portion of the LEFT  inguinal canal. A transverse incision was made.  Dissection was carried through the soft tissue to expose the inguinal canal and inguinal ligament along its lower edge. The external oblique fascia was split along the course of its fibers, exposing the inguinal canal. The ligament  and nerve were looped using a Penrose drain and reflected out of the field. The  Indirect defect was exposed and a piece of prolene hernia system ultrapro mesh was and placed into  the defect. Interupted 1-0  Vicryl and novafil suture were  then used  to repair the defect, with the suture being sewn from the pubic tubercle inferiorly and superiorly along the canal to a level just beyond the internal ring. The mesh was split to allow passage of the ligament and  into the canal without entrapment. The ilioinguinal nerve was divided as it exited the muscle due to tethering.  The contents were then returned to canal and the external oblique fashion was then closed in a continuous fashion using 3-0 Vicryl suture taking care not to cause entrapment. Scarpa's layer closed with 3 0 vicryl and 4 0 monocryl used to close the skin.  Dermabond used for dressing.  Instrument, sponge, and needle counts were correct prior to closure and at the conclusion of the case.  Findings: Hernia as above  Estimated Blood Loss: Minimal         Drains: None         Total IV Fluids: 400 mL         Specimens: none               Complications: None; patient tolerated the procedure well.         Disposition: PACU - hemodynamically stable.  Condition: stable

## 2013-04-12 NOTE — Anesthesia Postprocedure Evaluation (Signed)
  Anesthesia Post-op Note  Patient: Cindy Deleon  Procedure(s) Performed: Procedure(s):  REPAIR LEFT INGUINAL HERNIA (Left)  Patient Location: PACU  Anesthesia Type:General  Level of Consciousness: awake, alert  and oriented  Airway and Oxygen Therapy: Patient Spontanous Breathing and Patient connected to face mask oxygen  Post-op Pain: none  Post-op Assessment: Post-op Vital signs reviewed  Post-op Vital Signs: Reviewed  Complications: No apparent anesthesia complications

## 2013-04-13 ENCOUNTER — Telehealth (INDEPENDENT_AMBULATORY_CARE_PROVIDER_SITE_OTHER): Payer: Self-pay | Admitting: General Surgery

## 2013-04-13 NOTE — Telephone Encounter (Signed)
Patient's husband called in explaining that the patient was having a hard time voiding s/p LIH on 12414.  Denies any fever, chills, burning with urination,; however does explain that his wife feels as though her bladder is getting full and she is becoming uncomfortable.  I explained to him that I would get in touch with Dr. Luisa Hart and find out if he would like me to send her for any testing or to have a foley put in.  Informed him I would give him a call back

## 2013-04-13 NOTE — Telephone Encounter (Signed)
Called patient after paging Dr. Luisa Hart.  He wanted her to go to ER for indwelling cath and a referral to urologist. I called and spoke with pt and asked if things had gotten any better since I had spoken with her husband this morning. She explained that since they had spoken with me this morning, she has been drinking a lot of gatorade and water to well hydrate herself and see if she could just flush out her system.  She explained that she is now able to void completely and that it is just taking longer than normal.  Still denies and burning with urination.  I informed her that we could bring her in for a foley catheter insertion to see if there is any residual, but that it sounds like she is voiding completely now.  She explained that because it is getting better, she would rather hold off on everything.  She will call back if it gets worse over the weekend or she will go to the ER.

## 2013-04-16 ENCOUNTER — Encounter (HOSPITAL_BASED_OUTPATIENT_CLINIC_OR_DEPARTMENT_OTHER): Payer: Self-pay | Admitting: Surgery

## 2013-05-01 ENCOUNTER — Ambulatory Visit (INDEPENDENT_AMBULATORY_CARE_PROVIDER_SITE_OTHER): Payer: Self-pay | Admitting: Surgery

## 2013-05-01 ENCOUNTER — Encounter (INDEPENDENT_AMBULATORY_CARE_PROVIDER_SITE_OTHER): Payer: Self-pay | Admitting: Surgery

## 2013-05-01 VITALS — BP 120/72 | HR 70 | Temp 98.8°F | Resp 18 | Ht 66.0 in | Wt 142.8 lb

## 2013-05-01 DIAGNOSIS — Z9889 Other specified postprocedural states: Secondary | ICD-10-CM

## 2013-05-01 NOTE — Patient Instructions (Signed)
Ibuprofen 800 mg every 8 hours for 2 days then as needed.  Resume full activity in 2 weeks.

## 2013-05-01 NOTE — Progress Notes (Signed)
Pt returns today after left inguinal  hernia repair.  Pain is well controlled.  Bowels are functioning.  Wound is clean.  On exam:  Incision is clean /dry/intact.  Area is soft without signs of hernia recurrence.  Impression:  Status repair of hernia left inguinal  Plan:  RTC PRN  Return to full activity in 2 weeks.Marland Kitchen

## 2013-07-25 ENCOUNTER — Other Ambulatory Visit: Payer: Self-pay | Admitting: Internal Medicine

## 2013-07-25 DIAGNOSIS — Z1231 Encounter for screening mammogram for malignant neoplasm of breast: Secondary | ICD-10-CM

## 2013-07-27 ENCOUNTER — Ambulatory Visit
Admission: RE | Admit: 2013-07-27 | Discharge: 2013-07-27 | Disposition: A | Discharge status: Home / Self Care | Payer: 59 | Source: Ambulatory Visit

## 2013-07-27 ENCOUNTER — Other Ambulatory Visit: Payer: Self-pay

## 2013-07-27 ENCOUNTER — Ambulatory Visit: Payer: Self-pay

## 2013-07-27 DIAGNOSIS — Z1231 Encounter for screening mammogram for malignant neoplasm of breast: Secondary | ICD-10-CM

## 2015-03-18 IMAGING — CT CT ABD-PELV W/ CM
3 of 5 series · 12 of 36 positions shown, 18 images · IV contrast (READICAT/WATER & [ID] OMNI 300)
Comparison: None.

CLINICAL DATA: Generalized abdominal pain and bloating, nausea,
diarrhea, history of irritable bowel syndrome

EXAM:
CT ABDOMEN AND PELVIS WITH CONTRAST
TECHNIQUE: Multidetector CT imaging of the abdomen and pelvis was performed
using the standard protocol following bolus administration of
intravenous contrast.
CONTRAST:  100mL OMNIPAQUE IOHEXOL 300 MG/ML  SOLN

[Series 3: abd/pelvis with · axial · 0.66mm/px · z∈[-359,-19]mm · 8 of 88 slices shown, 13 images]
[im 10/88  soft-tissue]
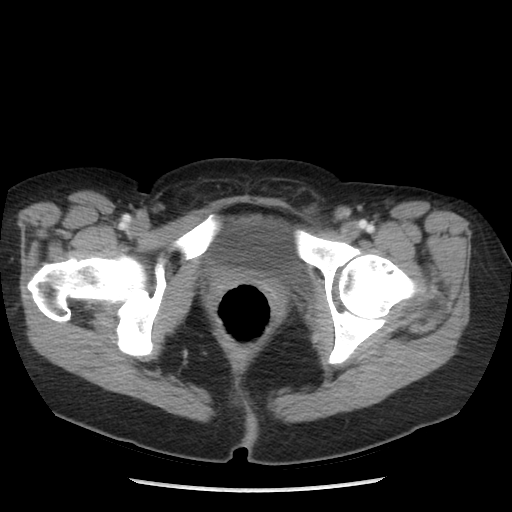
[im 10/88  bone]
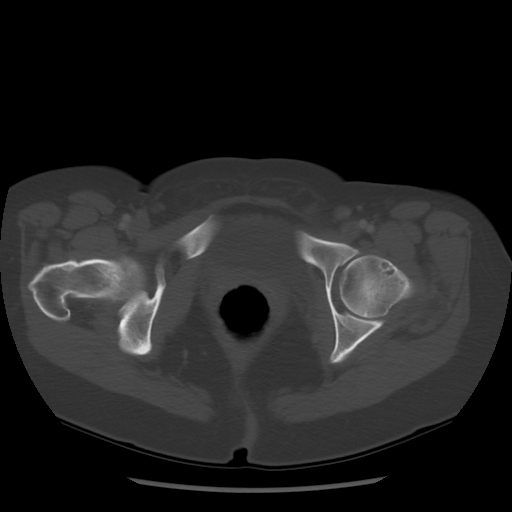
[im 20/88  soft-tissue]
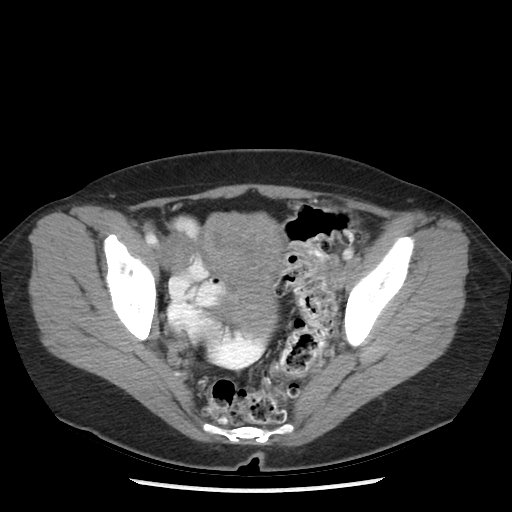
[im 30/88  soft-tissue]
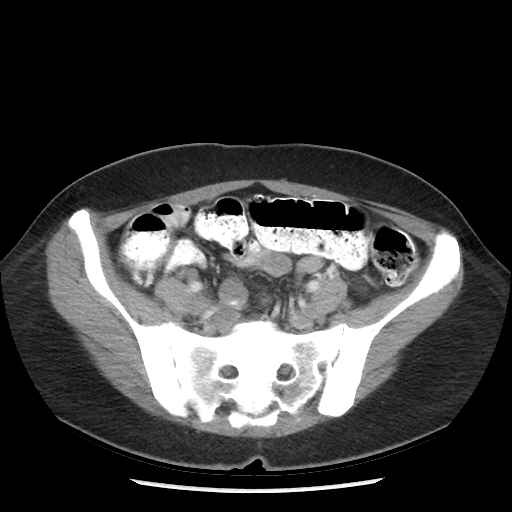
[im 39/88  soft-tissue]
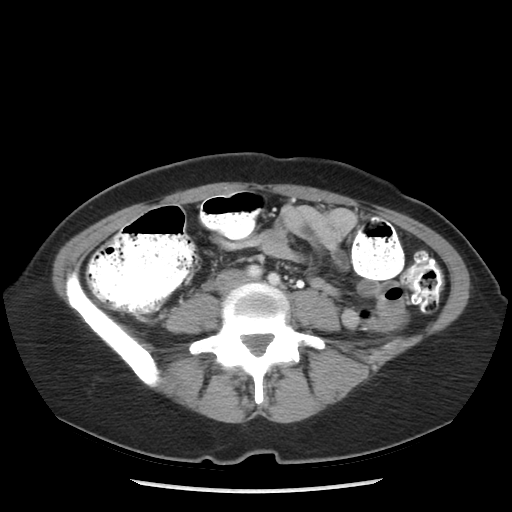
[im 49/88  soft-tissue]
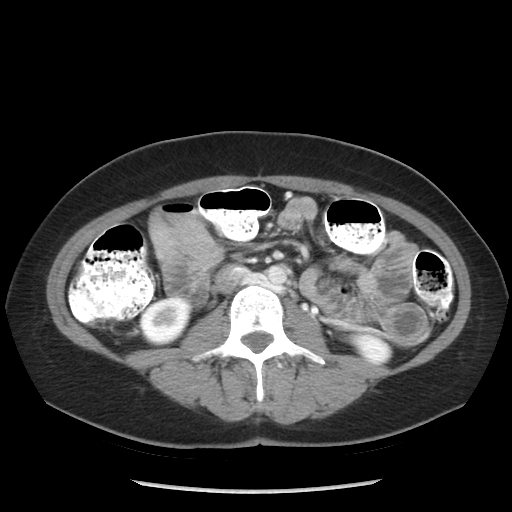
[im 49/88  lung]
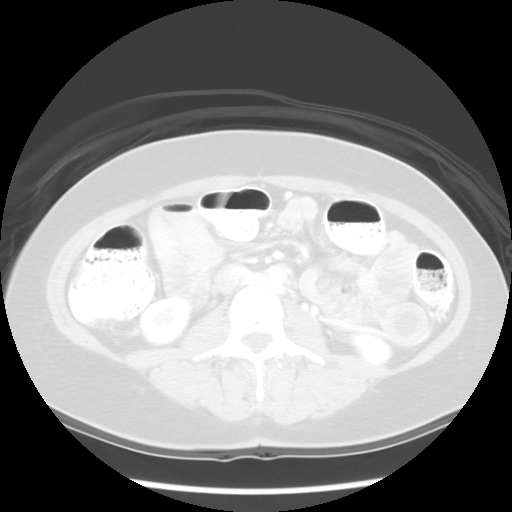
[im 59/88  soft-tissue]
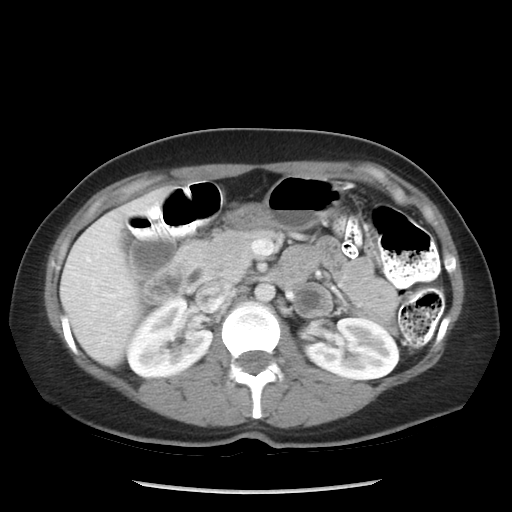
[im 59/88  lung]
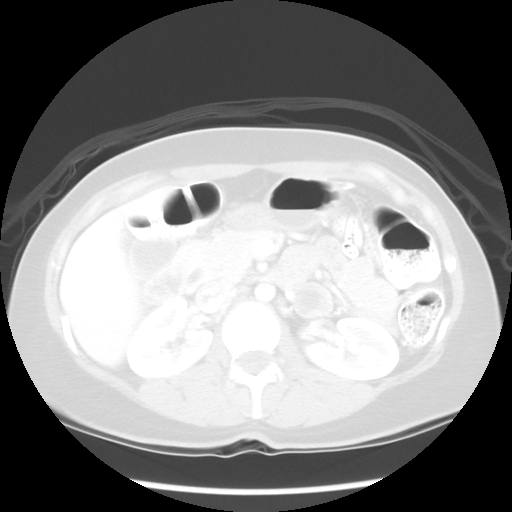
[im 68/88  soft-tissue]
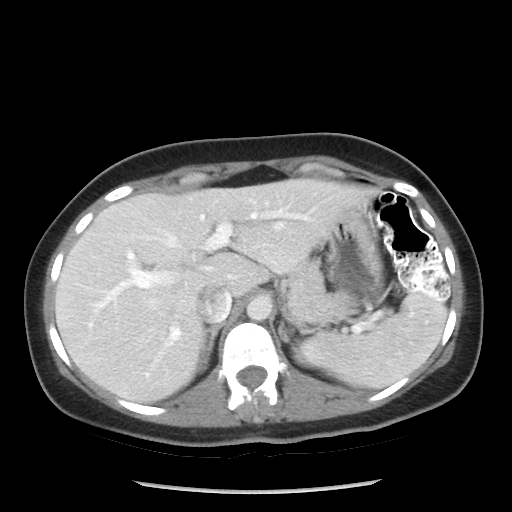
[im 68/88  lung]
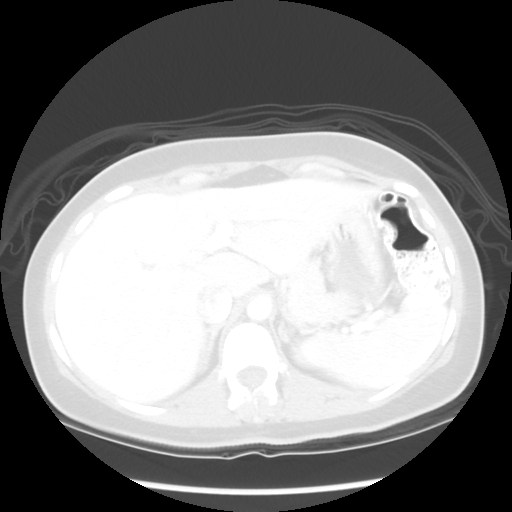
[im 78/88  soft-tissue]
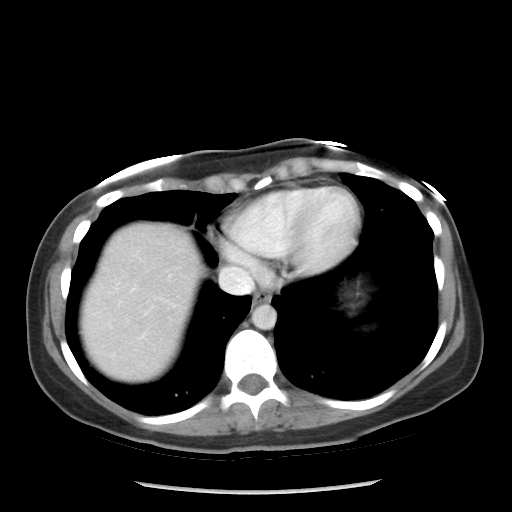
[im 78/88  lung]
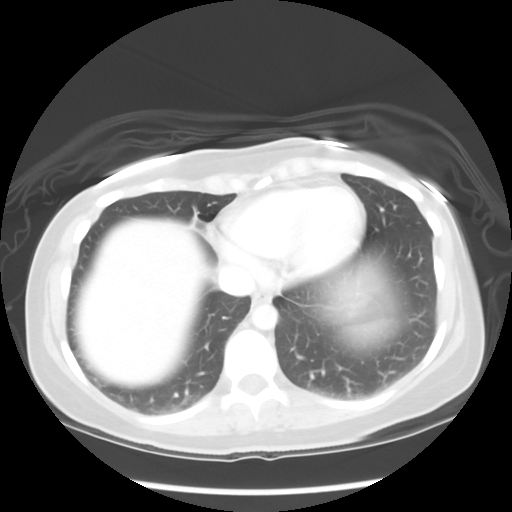

[Series 601: coronal body · coronal · 0.89mm/px · 1 of 102 slices shown, 2 images]
[im 34/102  soft-tissue]
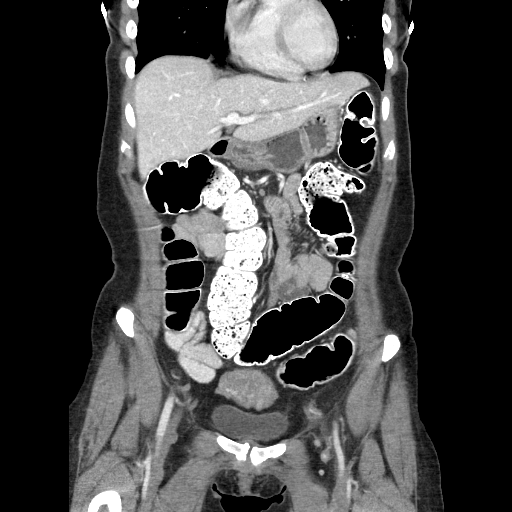
[im 34/102  bone]
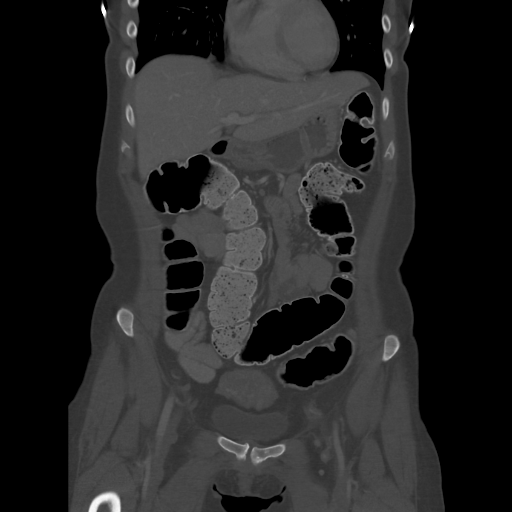

[Series 602: sagittal body · sagittal · 0.89mm/px · 3 of 137 slices shown]
[im 10/137  soft-tissue]
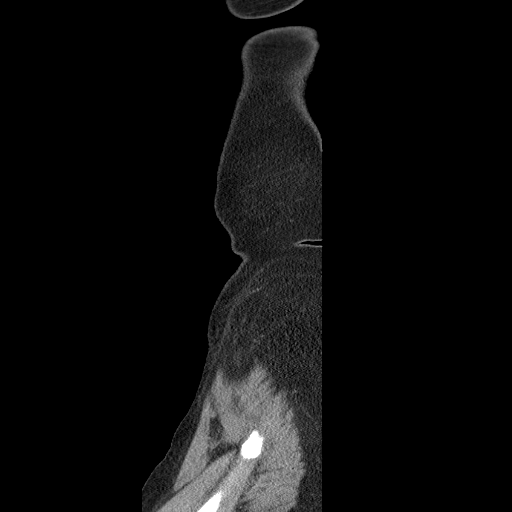
[im 28/137  soft-tissue]
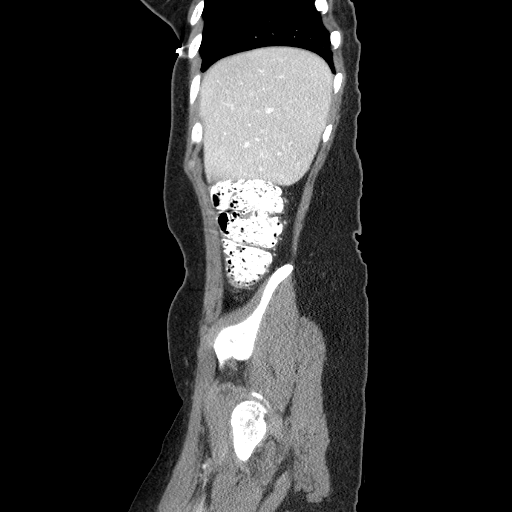
[im 46/137  soft-tissue]
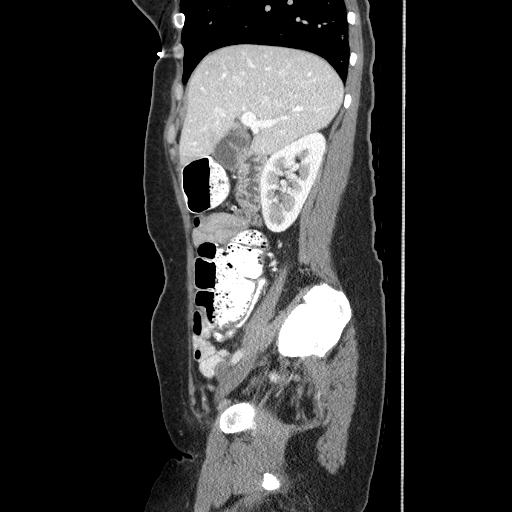

[12 of 36 positions shown; findings below may reference images not displayed]

FINDINGS: The lung bases are clear. The liver enhances with no focal
abnormality and no ductal dilatation is seen. No calcified
gallstones are noted. The common bile duct is prominent however
measuring up to 8 mm in diameter. Correlation with liver function
tests is recommended. A distal common bile duct calculus, stricture,
or mass cannot be excluded. The pancreas is unremarkable with no
evidence of mass. The adrenal glands and spleen are unremarkable
with a small splenule present. The stomach is not well distended but
no abnormality is noted. The kidneys enhance and only a single 3 mm
nonobstructing right lower pole renal calculus is noted. On delayed
images, the pelvocaliceal systems are unremarkable and the proximal
ureters are normal in caliber. The abdominal aorta is normal in
caliber. No adenopathy is seen.

The uterus is normal in size and minimally inhomogeneous. Small
uterine fibroids cannot be excluded. Small ovarian follicles are
noted. A probable collapsing cyst on the left is noted. Fat does
enter both inguinal canals. There are multiple rectosigmoid colonic
diverticula present, but no diverticulitis is seen. Diverticula also
are scattered throughout the entire colon. The appendix and terminal
ileum are unremarkable. No fluid is seen within the pelvis.
Sclerosis is noted around both SI joints most likely due to stress
sclerosis. No free fluid is seen within the pelvis. The urinary
bladder is unremarkable.
IMPRESSION: 1. Multiple colonic diverticula primarily within the rectosigmoid
colon.
2. 3 mm nonobstructing right lower pole renal calculus.
3. Somewhat prominent common bile duct. Correlate with liver
function tests.
4. Collapsing left ovarian cyst.

## 2016-03-07 ENCOUNTER — Encounter (HOSPITAL_COMMUNITY): Payer: Self-pay | Admitting: Emergency Medicine

## 2016-03-07 ENCOUNTER — Ambulatory Visit (HOSPITAL_COMMUNITY)
Admission: EM | Admit: 2016-03-07 | Discharge: 2016-03-07 | Disposition: A | Discharge status: Home / Self Care | Payer: 59 | Attending: Emergency Medicine | Admitting: Emergency Medicine

## 2016-03-07 DIAGNOSIS — J329 Chronic sinusitis, unspecified: Secondary | ICD-10-CM

## 2016-03-07 DIAGNOSIS — H6993 Unspecified Eustachian tube disorder, bilateral: Secondary | ICD-10-CM

## 2016-03-07 DIAGNOSIS — J310 Chronic rhinitis: Secondary | ICD-10-CM

## 2016-03-07 DIAGNOSIS — T700XXA Otitic barotrauma, initial encounter: Secondary | ICD-10-CM | POA: Diagnosis not present

## 2016-03-07 DIAGNOSIS — H6983 Other specified disorders of Eustachian tube, bilateral: Secondary | ICD-10-CM

## 2016-03-07 DIAGNOSIS — R0982 Postnasal drip: Secondary | ICD-10-CM | POA: Diagnosis not present

## 2016-03-07 MED ORDER — PREDNISONE 50 MG PO TABS: ORAL_TABLET | ORAL | 0 refills | Status: DC

## 2016-03-07 NOTE — ED Triage Notes (Signed)
The patient presented to the [facility] with a complaint of sinus pain and pressure as well as a cough with chest wall pain that started 1 week ago. She stated that she started to have some mild dizziness that started today.

## 2016-03-07 NOTE — Discharge Instructions (Signed)
For nasal and head congestion may take Sudafed PE 10 mg every 4 hours as needed. Saline nasal spray used frequently. For drainage may use Allegra or Zyrtec. If you need stronger medicine to stop drainage may take Chlor-Trimeton 2-4 mg every 4 hours. This may cause drowsiness. Ibuprofen 600 mg every 6 hours as needed for pain, discomfort or fever. Drink plenty of fluids and stay well-hydrated. Flonase or Rhinocort nasal spray daily

## 2016-03-07 NOTE — ED Provider Notes (Cosign Needed)
Dictation #1 [id number]  [id number] CSN: [id number]     Arrival date & time 03/07/16  1724 History   First MD Initiated Contact with Patient 03/07/16 1934     Chief Complaint  Patient presents with  . Facial Pain   (Consider location/radiation/quality/duration/timing/severity/associated sxs/prior Treatment) 45 year old female complaining of headache, dizziness, runny nose, PND, cough, ringing in the years, nasal congestion, dry mouth. She is taking ibuprofen and Alka-Seltzer and chills. Symptoms began this week.      Past Medical History:  Diagnosis Date  . ADD (attention deficit disorder)   . Anxiety   . Depression   . Headache(784.0)   . Poor venous access    Past Surgical History:  Procedure Laterality Date  . HERNIA REPAIR    . INGUINAL HERNIA REPAIR Left 04/12/2013   Procedure:  REPAIR LEFT INGUINAL HERNIA;  Surgeon: Joyice Faster. Cornett, MD;  Location: [facility];  Service: General;  Laterality: Left;  . TONSILLECTOMY    . WISDOM TOOTH EXTRACTION     Family History  Problem Relation Age of Onset  . Cancer Maternal Grandmother     breast  . Cancer Paternal Grandfather     colon and lung   Social History  Substance Use Topics  . Smoking status: Current Some Day Smoker    Packs/day: 0.25  . Smokeless tobacco: Never Used  . Alcohol use No   OB History    No data available     Review of Systems  Constitutional: Negative for activity change, appetite change, chills, fatigue and fever.  HENT: Positive for congestion, postnasal drip, rhinorrhea and sinus pressure. Negative for ear discharge, facial swelling and trouble swallowing.   Eyes: Negative.   Respiratory: Negative.  Negative for shortness of breath.   Cardiovascular: Positive for chest pain.  Gastrointestinal: Negative.   Musculoskeletal: Negative for neck pain and neck stiffness.  Skin: Negative for pallor and rash.  Neurological: Negative.   All other systems reviewed and are  negative.   Allergies  Sulfonamide derivatives  Home Medications   Prior to Admission medications   Medication Sig Start Date End Date Taking? Authorizing Provider  clonazePAM (KLONOPIN) 0.5 MG tablet Take 0.5 mg by mouth 2 (two) times daily as needed for anxiety.   Yes Historical Provider, MD  ibuprofen (ADVIL,MOTRIN) 800 MG tablet Take 800 mg by mouth every 8 (eight) hours as needed.   Yes Historical Provider, MD  rizatriptan (MAXALT) 10 MG tablet Take 10 mg by mouth as needed for migraine. May repeat in 2 hours if needed   Yes Historical Provider, MD  predniSONE (DELTASONE) 50 MG tablet 1 tab po daily for 6 days. Take with food. 03/07/16   Janne Napoleon, NP  sertraline (ZOLOFT) 100 MG tablet Take 150 mg by mouth at bedtime.     Historical Provider, MD   Meds Ordered and Administered this Visit  Medications - No data to display  BP 129/98 (BP Location: Right Arm)   Pulse 94   Temp 98.7 F (37.1 C) (Oral)   Resp 18   LMP 03/01/2016 (Exact Date)   SpO2 99%  No data found.   Physical Exam  Constitutional: She is oriented to person, place, and time. She appears well-developed and well-nourished. No distress.  HENT:  Right Ear: External ear normal.  Left Ear: External ear normal.  Oropharynx with minor erythema, no swelling or exudates. Evidence of clear PND.  Bilateral TMs are retracted. No erythema or effusion.  Eyes: EOM are normal.  Pupils are equal, round, and reactive to light.  Neck: Normal range of motion. Neck supple.  Cardiovascular: Normal rate, regular rhythm, normal heart sounds and intact distal pulses.   Pulmonary/Chest: Effort normal and breath sounds normal. No respiratory distress. She has no wheezes. She has no rales.  Musculoskeletal: Normal range of motion. She exhibits no edema.  Lymphadenopathy:    She has no cervical adenopathy.  Neurological: She is alert and oriented to person, place, and time.  Skin: Skin is warm and dry. Capillary refill takes less  than 2 seconds.  Psychiatric: She has a normal mood and affect.  Nursing note and vitals reviewed.   Urgent Care Course   Clinical Course    Procedures (including critical care time)  Labs Review Labs Reviewed - No data to display  Imaging Review No results found.   Visual Acuity Review  Right Eye Distance:   Left Eye Distance:   Bilateral Distance:    Right Eye Near:   Left Eye Near:    Bilateral Near:         MDM   1. Rhinosinusitis   2. Barotitis media, initial encounter   3. PND (post-nasal drip)   4. ETD (Eustachian tube dysfunction), bilateral    For nasal and head congestion may take Sudafed PE 10 mg every 4 hours as needed. Saline nasal spray used frequently. For drainage may use Allegra or Zyrtec. If you need stronger medicine to stop drainage may take Chlor-Trimeton 2-4 mg every 4 hours. This may cause drowsiness. Ibuprofen 600 mg every 6 hours as needed for pain, discomfort or fever. Drink plenty of fluids and stay well-hydrated. Flonase or Rhinocort nasal spray daily Meds ordered this encounter  Medications  . predniSONE (DELTASONE) 50 MG tablet    Sig: 1 tab po daily for 6 days. Take with food.    Dispense:  6 tablet    Refill:  0    Order Specific Question:   Supervising Provider    Answer:   Melynda Ripple [[id number]]       Janne Napoleon, NP 03/07/16 2006

## 2016-05-20 DIAGNOSIS — Z79899 Other long term (current) drug therapy: Secondary | ICD-10-CM | POA: Diagnosis not present

## 2016-05-20 DIAGNOSIS — E782 Mixed hyperlipidemia: Secondary | ICD-10-CM | POA: Diagnosis not present

## 2016-07-21 DIAGNOSIS — G43709 Chronic migraine without aura, not intractable, without status migrainosus: Secondary | ICD-10-CM | POA: Diagnosis not present

## 2016-09-28 DIAGNOSIS — Z01419 Encounter for gynecological examination (general) (routine) without abnormal findings: Secondary | ICD-10-CM | POA: Diagnosis not present

## 2016-09-28 DIAGNOSIS — Z0000 Encounter for general adult medical examination without abnormal findings: Secondary | ICD-10-CM | POA: Diagnosis not present

## 2016-09-28 DIAGNOSIS — Z124 Encounter for screening for malignant neoplasm of cervix: Secondary | ICD-10-CM | POA: Diagnosis not present

## 2016-09-28 DIAGNOSIS — E7800 Pure hypercholesterolemia, unspecified: Secondary | ICD-10-CM | POA: Diagnosis not present

## 2016-09-28 DIAGNOSIS — R21 Rash and other nonspecific skin eruption: Secondary | ICD-10-CM | POA: Diagnosis not present

## 2016-09-29 ENCOUNTER — Other Ambulatory Visit: Payer: Self-pay | Admitting: Obstetrics & Gynecology

## 2016-09-29 DIAGNOSIS — N644 Mastodynia: Secondary | ICD-10-CM

## 2016-10-05 ENCOUNTER — Other Ambulatory Visit: Payer: 59

## 2016-10-08 ENCOUNTER — Ambulatory Visit
Admission: RE | Admit: 2016-10-08 | Discharge: 2016-10-08 | Disposition: A | Discharge status: Home / Self Care | Payer: 59 | Source: Ambulatory Visit | Attending: Obstetrics & Gynecology | Admitting: Obstetrics & Gynecology

## 2016-10-08 DIAGNOSIS — R928 Other abnormal and inconclusive findings on diagnostic imaging of breast: Secondary | ICD-10-CM | POA: Diagnosis not present

## 2016-10-08 DIAGNOSIS — N644 Mastodynia: Secondary | ICD-10-CM

## 2016-11-08 DIAGNOSIS — R768 Other specified abnormal immunological findings in serum: Secondary | ICD-10-CM | POA: Diagnosis not present

## 2016-11-08 DIAGNOSIS — R682 Dry mouth, unspecified: Secondary | ICD-10-CM | POA: Diagnosis not present

## 2016-11-23 DIAGNOSIS — Z79899 Other long term (current) drug therapy: Secondary | ICD-10-CM | POA: Diagnosis not present

## 2016-11-23 DIAGNOSIS — E782 Mixed hyperlipidemia: Secondary | ICD-10-CM | POA: Diagnosis not present

## 2016-12-02 DIAGNOSIS — H16223 Keratoconjunctivitis sicca, not specified as Sjogren's, bilateral: Secondary | ICD-10-CM | POA: Diagnosis not present

## 2017-01-13 DIAGNOSIS — Z79899 Other long term (current) drug therapy: Secondary | ICD-10-CM | POA: Diagnosis not present

## 2017-03-15 DIAGNOSIS — E782 Mixed hyperlipidemia: Secondary | ICD-10-CM | POA: Diagnosis not present

## 2017-05-12 DIAGNOSIS — L7211 Pilar cyst: Secondary | ICD-10-CM | POA: Diagnosis not present

## 2017-05-13 DIAGNOSIS — Z79899 Other long term (current) drug therapy: Secondary | ICD-10-CM | POA: Diagnosis not present

## 2017-05-13 DIAGNOSIS — E782 Mixed hyperlipidemia: Secondary | ICD-10-CM | POA: Diagnosis not present

## 2017-05-30 DIAGNOSIS — H6121 Impacted cerumen, right ear: Secondary | ICD-10-CM | POA: Diagnosis not present

## 2017-05-30 DIAGNOSIS — R0981 Nasal congestion: Secondary | ICD-10-CM | POA: Diagnosis not present

## 2017-05-30 DIAGNOSIS — R42 Dizziness and giddiness: Secondary | ICD-10-CM | POA: Diagnosis not present

## 2017-06-03 DIAGNOSIS — J069 Acute upper respiratory infection, unspecified: Secondary | ICD-10-CM | POA: Diagnosis not present

## 2017-06-03 DIAGNOSIS — R42 Dizziness and giddiness: Secondary | ICD-10-CM | POA: Diagnosis not present

## 2017-07-14 DIAGNOSIS — Z79899 Other long term (current) drug therapy: Secondary | ICD-10-CM | POA: Diagnosis not present

## 2017-09-16 ENCOUNTER — Ambulatory Visit (INDEPENDENT_AMBULATORY_CARE_PROVIDER_SITE_OTHER): Payer: 59 | Admitting: Orthopedic Surgery

## 2017-09-28 ENCOUNTER — Ambulatory Visit (INDEPENDENT_AMBULATORY_CARE_PROVIDER_SITE_OTHER): Payer: 59 | Admitting: Orthopedic Surgery

## 2018-02-13 ENCOUNTER — Encounter: Payer: Self-pay | Admitting: Podiatry

## 2018-02-13 ENCOUNTER — Ambulatory Visit (INDEPENDENT_AMBULATORY_CARE_PROVIDER_SITE_OTHER): Payer: 59

## 2018-02-13 ENCOUNTER — Ambulatory Visit: Payer: 59 | Admitting: Podiatry

## 2018-02-13 ENCOUNTER — Telehealth: Payer: Self-pay | Admitting: *Deleted

## 2018-02-13 ENCOUNTER — Other Ambulatory Visit: Payer: Self-pay

## 2018-02-13 VITALS — BP 114/78 | HR 88

## 2018-02-13 DIAGNOSIS — F988 Other specified behavioral and emotional disorders with onset usually occurring in childhood and adolescence: Secondary | ICD-10-CM | POA: Insufficient documentation

## 2018-02-13 DIAGNOSIS — IMO0002 Reserved for concepts with insufficient information to code with codable children: Secondary | ICD-10-CM | POA: Insufficient documentation

## 2018-02-13 DIAGNOSIS — M21611 Bunion of right foot: Secondary | ICD-10-CM | POA: Diagnosis not present

## 2018-02-13 DIAGNOSIS — M21612 Bunion of left foot: Secondary | ICD-10-CM

## 2018-02-13 DIAGNOSIS — F329 Major depressive disorder, single episode, unspecified: Secondary | ICD-10-CM | POA: Insufficient documentation

## 2018-02-13 DIAGNOSIS — E7800 Pure hypercholesterolemia, unspecified: Secondary | ICD-10-CM | POA: Insufficient documentation

## 2018-02-13 DIAGNOSIS — E663 Overweight: Secondary | ICD-10-CM

## 2018-02-13 DIAGNOSIS — F17200 Nicotine dependence, unspecified, uncomplicated: Secondary | ICD-10-CM | POA: Insufficient documentation

## 2018-02-13 DIAGNOSIS — F32A Depression, unspecified: Secondary | ICD-10-CM | POA: Insufficient documentation

## 2018-02-13 DIAGNOSIS — N920 Excessive and frequent menstruation with regular cycle: Secondary | ICD-10-CM | POA: Insufficient documentation

## 2018-02-13 DIAGNOSIS — N760 Acute vaginitis: Secondary | ICD-10-CM | POA: Insufficient documentation

## 2018-02-13 DIAGNOSIS — R1032 Left lower quadrant pain: Secondary | ICD-10-CM | POA: Insufficient documentation

## 2018-02-13 DIAGNOSIS — R5383 Other fatigue: Secondary | ICD-10-CM | POA: Insufficient documentation

## 2018-02-13 NOTE — Patient Instructions (Signed)
Pre-Operative Instructions  Congratulations, you have decided to take an important step towards improving your quality of life.  You can be assured that the doctors and staff at [facility] will be with you every step of the way.  Here are some important things you should know:  1. Plan to be at the surgery center/hospital at least 1 (one) hour prior to your scheduled time, unless otherwise directed by the surgical center/hospital staff.  You must have a responsible adult accompany you, remain during the surgery and drive you home.  Make sure you have directions to the surgical center/hospital to ensure you arrive on time. 2. If you are having surgery at [facility] or [facility] hospitals, you will need a copy of your medical history and physical form from your family physician within one month prior to the date of surgery. We will give you a form for your primary physician to complete.  3. We make every effort to accommodate the date you request for surgery.  However, there are times where surgery dates or times have to be moved.  We will contact you as soon as possible if a change in schedule is required.   4. No aspirin/ibuprofen for one week before surgery.  If you are on aspirin, any non-steroidal anti-inflammatory medications (Mobic, Aleve, Ibuprofen) should not be taken seven (7) days prior to your surgery.  You make take Tylenol for pain prior to surgery.  5. Medications - If you are taking daily heart and blood pressure medications, seizure, reflux, allergy, asthma, anxiety, pain or diabetes medications, make sure you notify the surgery center/hospital before the day of surgery so they can tell you which medications you should take or avoid the day of surgery. 6. No food or drink after midnight the night before surgery unless directed otherwise by surgical center/hospital staff. 7. No alcoholic beverages 24-hours prior to surgery.  No smoking 24-hours prior or 24-hours after  surgery. 8. Wear loose pants or shorts. They should be loose enough to fit over bandages, boots, and casts. 9. Don't wear slip-on shoes. Sneakers are preferred. 10. Bring your boot with you to the surgery center/hospital.  Also bring crutches or a walker if your physician has prescribed it for you.  If you do not have this equipment, it will be provided for you after surgery. 11. If you have not been contacted by the surgery center/hospital by the day before your surgery, call to confirm the date and time of your surgery. 12. Leave-time from work may vary depending on the type of surgery you have.  Appropriate arrangements should be made prior to surgery with your employer. 13. Prescriptions will be provided immediately following surgery by your doctor.  Fill these as soon as possible after surgery and take the medication as directed. Pain medications will not be refilled on weekends and must be approved by the doctor. 14. Remove nail polish on the operative foot and avoid getting pedicures prior to surgery. 15. Wash the night before surgery.  The night before surgery wash the foot and leg well with water and the antibacterial soap provided. Be sure to pay special attention to beneath the toenails and in between the toes.  Wash for at least three (3) minutes. Rinse thoroughly with water and dry well with a towel.  Perform this wash unless told not to do so by your physician.  Enclosed: 1 Ice pack (please put in freezer the night before surgery)   1 Hibiclens skin cleaner     Pre-op instructions  If you have any questions regarding the instructions, please do not hesitate to call our office.  [place]: [address], [place], [place] [address] -- [phone]  [place]: [address] , [place] [address] -- [phone]  [place]: [address]  [place], [place] [address] -- [phone]  [place]: [address], [address], [place], [place] [address] -- [phone]  Website:  [email or web address] 

## 2018-02-13 NOTE — Telephone Encounter (Signed)
I am calling you in regards to your surgery that you scheduled for 03/02/2018.  Dr. Amalia Hailey cannot do it that dated.  We need to reschedule your surgery.  "That's okay."  He can do it on 03/09/2018 or 03/16/2018.  "Cindy Deleon, let's do it on November 7.  I have to take my girls out on Halloween."  I'll get it scheduled.  Someone from the surgical center will call you a day or two prior to your surgery date and they will give you your arrival time.

## 2018-02-14 NOTE — Progress Notes (Signed)
   Subjective: 47 year old female presenting today as a new patient with a chief complaint of painful bunions bilaterally, left worse than right, that have been symptomatic for the past several years. She states the pain has worsened over the past five years. Walking and wearing certain shoes increases the pain. She has not done anything for treatment. Patient is here for further evaluation and treatment.   Past Medical History:  Diagnosis Date  . ADD (attention deficit disorder)   . Anxiety   . Depression   . Headache(784.0)   . Poor venous access       Objective: Physical Exam General: The patient is alert and oriented x3 in no acute distress.  Dermatology: Skin is cool, dry and supple bilateral lower extremities. Negative for open lesions or macerations.  Vascular: Palpable pedal pulses bilaterally. No edema or erythema noted. Capillary refill within normal limits.  Neurological: Epicritic and protective threshold grossly intact bilaterally.   Musculoskeletal Exam: Clinical evidence of bunion deformity noted to the respective foot. There is a moderate pain on palpation range of motion of the first MPJ. Lateral deviation of the hallux noted consistent with hallux abductovalgus.  Radiographic Exam: Increased intermetatarsal angle greater than 15 with a hallux abductus angle greater than 30 noted on AP view. Moderate degenerative changes noted within the first MPJ.  Assessment: 1. HAV w/ bunion deformity bilateral     Plan of Care:  1. Patient was evaluated. X-Rays reviewed. 2. Today we discussed the conservative versus surgical management of the presenting pathology. The patient opts for surgical management. All possible complications and details of the procedure were explained. All patient questions were answered. No guarantees were expressed or implied. 3. Authorization for surgery was initiated today. Surgery will consist of bunionectomy with osteotomy left.  4. CAM boot  dispensed.  5. Return to clinic one week post op.     Edrick Kins, DPM [facility]  Dr. Edrick Kins, [place]                                        [place], [place] [address]                Office [phone]  Fax [phone]

## 2018-03-01 ENCOUNTER — Encounter: Payer: Self-pay | Admitting: *Deleted

## 2018-03-01 ENCOUNTER — Telehealth: Payer: Self-pay | Admitting: *Deleted

## 2018-03-01 NOTE — Progress Notes (Signed)
Per Dr. Amalia Hailey, I sent a letter of medical clearance to [facility].

## 2018-03-01 NOTE — Telephone Encounter (Signed)
"  I was supposed to have surgery on November 7 with Dr. Amalia Hailey.  I'm going to have to cancel that.  I'm going to have to call back to reschedule it for a later date.  My dad is sick and I want to be able to take care of him before I have the surgery.  So it may be a couple of months out.  I'll definitely be calling to reschedule.  I just hope I don't have to come in again to see Dr. Amalia Hailey again.  Just call me back."  Cindy Deleon can you take care of this for me please.)

## 2018-03-22 ENCOUNTER — Encounter: Payer: 59 | Admitting: Podiatry

## 2018-03-29 ENCOUNTER — Encounter: Payer: 59 | Admitting: Podiatry

## 2018-04-19 ENCOUNTER — Telehealth: Payer: Self-pay | Admitting: Podiatry

## 2018-04-19 NOTE — Telephone Encounter (Signed)
I had surgery scheduled about a month ago with Dr. Amalia Hailey that I had to cancel. I was hoping to reschedule that surgery for January. My number is [phone]. Thank you.

## 2018-05-05 NOTE — Telephone Encounter (Signed)
I left her a message that Dr. Amalia Hailey does not have anytime available in January.  His next available date will be June 22, 2018.  I asked her to call me back on Monday if she would like to schedule an appointment.

## 2019-02-22 ENCOUNTER — Ambulatory Visit: Payer: Self-pay | Admitting: Surgery

## 2019-02-22 NOTE — H&P (View-Only) (Signed)
Surgical H&P  CC: hernia  HPI:  This is a pleasant 48 year old woman who presents with a right inguinal hernia which has been present for several years. Denies pain or associated digestive symptoms. Notes that the hernia will protrude more certain times but is always reducible. She was also found to have an umbilical hernia by her primary care doctor. She has very rare shooting pain just left of midline from the pubis upwards, no pain related to the inguinal or umbilical hernias. Normal appetite, bowel movements are unchanged, she does express concern about weakness of the abdominal wall and the pelvic organs and has been worried about the exercises strengthening these together possible axillary hernia. She had an open left anal hernia repair by Dr. Brantley Stage in 2014 but no other abdominal surgeries. She works in Press photographer at [employer], does a lot of walking around, but no heavy lifting or strenuous activities.  Allergies  Allergen Reactions  . Sulfonamide Derivatives Itching    Past Medical History:  Diagnosis Date  . ADD (attention deficit disorder)   . Anxiety   . Depression   . Headache(784.0)   . Poor venous access     Past Surgical History:  Procedure Laterality Date  . HERNIA REPAIR    . INGUINAL HERNIA REPAIR Left 04/12/2013   Procedure:  REPAIR LEFT INGUINAL HERNIA;  Surgeon: Joyice Faster. Cornett, MD;  Location: [facility];  Service: General;  Laterality: Left;  . TONSILLECTOMY    . WISDOM TOOTH EXTRACTION      Family History  Problem Relation Age of Onset  . Cancer Maternal Grandmother        breast  . Cancer Paternal Grandfather        colon and lung  . Breast cancer Paternal Grandmother 8    Social History   Socioeconomic History  . Marital status: Married    Spouse name: Not on file  . Number of children: Not on file  . Years of education: Not on file  . Highest education level: Not on file  Occupational History  . Not on file   Social Needs  . Financial resource strain: Not on file  . Food insecurity    Worry: Not on file    Inability: Not on file  . Transportation needs    Medical: Not on file    Non-medical: Not on file  Tobacco Use  . Smoking status: Current Some Day Smoker    Packs/day: 0.25  . Smokeless tobacco: Never Used  Substance and Sexual Activity  . Alcohol use: No  . Drug use: No  . Sexual activity: Not on file  Lifestyle  . Physical activity    Days per week: Not on file    Minutes per session: Not on file  . Stress: Not on file  Relationships  . Social Herbalist on phone: Not on file    Gets together: Not on file    Attends religious service: Not on file    Active member of club or organization: Not on file    Attends meetings of clubs or organizations: Not on file    Relationship status: Not on file  Other Topics Concern  . Not on file  Social History Narrative  . Not on file    Current Outpatient Medications on File Prior to Visit  Medication Sig Dispense Refill  . amphetamine-dextroamphetamine (ADDERALL XR) 30 MG 24 hr capsule Adderall XR 30 mg capsule,extended release  TAKE ONE CAPSULE  BY MOUTH EVERY DAY    . ARIPiprazole (ABILIFY) 10 MG tablet aripiprazole 10 mg tablet  TAKE 1 TABLET BY MOUTH EVERY DAY    . atenolol (TENORMIN) 50 MG tablet atenolol 50 mg tablet  TAKE 1 TABLET BY MOUTH AT BEDTIME    . atorvastatin (LIPITOR) 40 MG tablet atorvastatin 40 mg tablet    . atorvastatin (LIPITOR) 40 MG tablet Take 40 mg by mouth daily.  5  . azithromycin (ZITHROMAX) 250 MG tablet azithromycin 250 mg tablet    . clonazePAM (KLONOPIN) 0.5 MG tablet Take 0.5 mg by mouth 2 (two) times daily as needed for anxiety.    Marland Kitchen doxycycline (VIBRA-TABS) 100 MG tablet doxycycline hyclate 100 mg tablet  TAKE 1 TABLET BY MOUTH TWICE A DAY FOR 7 DAYS    . DULoxetine (CYMBALTA) 60 MG capsule Take by mouth daily.  6  . eletriptan (RELPAX) 40 MG tablet TAKE 1 TABLET BY MOUTH AS DIRECTED  MAX OF 2 TABLET PER DAY  3  . fluconazole (DIFLUCAN) 150 MG tablet fluconazole 150 mg tablet    . fluticasone (FLONASE) 50 MCG/ACT nasal spray fluticasone 50 mcg/actuation nasal spray,suspension    . ibuprofen (ADVIL,MOTRIN) 800 MG tablet Take 800 mg by mouth every 8 (eight) hours as needed.    Marland Kitchen omeprazole (PRILOSEC) 20 MG capsule TAKE 1 CAPSULE BY MOUTH EVERY DAY BEFORE A MEAL  3  . PARoxetine (PAXIL) 20 MG tablet paroxetine 20 mg tablet  TAKE 1 TABLET BY MOUTH EVERY DAY FOR 1 WEEK THEN TAKE 2 TABLETS BY MOUTH EVERY DAY FOR 3 WEEKS    . predniSONE (DELTASONE) 50 MG tablet 1 tab po daily for 6 days. Take with food. 6 tablet 0  . rizatriptan (MAXALT) 10 MG tablet Take 10 mg by mouth as needed for migraine. May repeat in 2 hours if needed    . sertraline (ZOLOFT) 100 MG tablet Take 150 mg by mouth at bedtime.     . valACYclovir (VALTREX) 500 MG tablet valacyclovir 500 mg tablet     No current facility-administered medications on file prior to visit.     Review of Systems: General Present- Fatigue and Night Sweats. Not Present- Appetite Loss, Chills, Fever, Weight Gain and Weight Loss. Skin Present- Non-Healing Wounds. Not Present- Change in Wart/Mole, Dryness, Hives, Jaundice, New Lesions, Rash and Ulcer. HEENT Present- Earache, Ringing in the Ears and Sinus Pain. Not Present- Hearing Loss, Hoarseness, Nose Bleed, Oral Ulcers, Seasonal Allergies, Sore Throat, Visual Disturbances, Wears glasses/contact lenses and Yellow Eyes. Respiratory Not Present- Bloody sputum, Chronic Cough, Difficulty Breathing, Snoring and Wheezing. Breast Not Present- Breast Mass, Breast Pain, Nipple Discharge and Skin Changes. Cardiovascular Not Present- Chest Pain, Difficulty Breathing Lying Down, Leg Cramps, Palpitations, Rapid Heart Rate, Shortness of Breath and Swelling of Extremities. Gastrointestinal Present- Bloating and Change in Bowel Habits. Not Present- Abdominal Pain, Bloody Stool, Chronic diarrhea,  Constipation, Difficulty Swallowing, Excessive gas, Gets full quickly at meals, Hemorrhoids, Indigestion, Nausea, Rectal Pain and Vomiting. Female Genitourinary Not Present- Frequency, Nocturia, Painful Urination, Pelvic Pain and Urgency. Musculoskeletal Present- Back Pain and Joint Pain. Not Present- Joint Stiffness, Muscle Pain, Muscle Weakness and Swelling of Extremities. Neurological Present- Headaches. Not Present- Decreased Memory, Fainting, Numbness, Seizures, Tingling, Tremor, Trouble walking and Weakness. Psychiatric Present- Anxiety, Depression and Fearful. Not Present- Bipolar, Change in Sleep Pattern and Frequent crying. Endocrine Present- Cold Intolerance. Not Present- Excessive Hunger, Hair Changes, Heat Intolerance, Hot flashes and New Diabetes. Hematology Present- Easy Bruising. Not Present- Blood  Thinners, Excessive bleeding, Gland problems, HIV and Persistent Infections. All other systems negative  Physical Exam: Vitals Weight: 166.6 lb Height: 68in Body Surface Area: 1.89 m Body Mass Index: 25.33 kg/m  Temp.: 98.11F(Temporal)  Pulse: 94 (Regular)  BP: 138/74 (Sitting, Left Arm, Standard)  Gen: alert and well appearing Eye: extraocular motion intact, no scleral icterus ENT: moist mucus membranes, dentition intact Neck: no mass or thyromegaly Chest: unlabored respirations, symmetrical air entry, clear bilaterally CV: regular rate and rhythm, no pedal edema Abdomen: soft, nontender, nondistended. Chronically incarcerated fat at the umbilicus, no palpable fascial defect. There is a small reduced right inguinal hernia. No palpable recurrence of the left inguinal hernia. MSK: strength symmetrical throughout, no deformity Neuro: grossly intact, normal gait Psych: normal mood and affect, appropriate insight Skin: warm and dry, no rash or lesion on limited exam   CBC Latest Ref Rng & Units 04/10/2013 02/25/2009 02/23/2009  WBC 4.0 - 10.5 K/uL 7.6 8.7 9.2   Hemoglobin 12.0 - 15.0 g/dL 15.0 11.3(L) 12.7  Hematocrit 36.0 - 46.0 % 43.0 34.0(L) 38.1  Platelets 150 - 400 K/uL 185 204 228    CMP Latest Ref Rng & Units 04/10/2013 01/28/2010 12/26/2008  Glucose 70 - 99 mg/dL 76 93 84  BUN 6 - 23 mg/dL 11 12 5(L)  Creatinine 0.50 - 1.10 mg/dL 0.75 0.74 0.52  Sodium 135 - 145 mEq/L 140 140 137  Potassium 3.5 - 5.1 mEq/L 4.1 4.4 4.6  Chloride 96 - 112 mEq/L 100 105 108  CO2 19 - 32 mEq/L 30 23 21   Calcium 8.4 - 10.5 mg/dL 9.9 9.6 9.6  Total Protein 6.0 - 8.3 g/dL 7.7 6.9 6.1  Total Bilirubin 0.3 - 1.2 mg/dL 0.2(L) 0.2(L) 0.7  Alkaline Phos 39 - 117 U/L 59 50 62  AST 0 - 37 U/L 27 19 30   ALT 0 - 35 U/L 24 18 22     No results found for: INR, PROTIME  Imaging: No results found.   A/P: RIGHT INGUINAL HERNIA (K40.90) Story: Minimally symptomatic. We discussed options including ongoing observation with possible outcomes of no change/no symptoms, increasing size or pain from the hernia, and small risk of bowel incarceration. Alternately option of laparoscopic/ robotic repair and we discussed the technique as well as risks of surgery including bleeding, infection, pain, scarring, injury to intra-abdominal or retroperitoneal structures including bowel, bladder, blood vessels, nerves, discussed risk of hernia recurrence, use of mesh. This was allowed [place] to visualize the umbilicus- she had a CT scan several years ago that does show chronically inspissated fat at the umbilicus but there was no visible fascial defect there, but there is no fascial defect there is no benefit to intervening on this area- but if there is a partial defect we can repair this at the time. We can also assess the left-sided ensure no recurrence, if recurrence present can repair this at the same time. Questions were answered. She wishes to proceed with scheduling surgery.   Romana Juniper, MD [facility] Surgery, [place] Pager [phone]

## 2019-02-22 NOTE — H&P (Signed)
Surgical H&P  CC: hernia  HPI:  This is a pleasant 48 year old woman who presents with a right inguinal hernia which has been present for several years. Denies pain or associated digestive symptoms. Notes that the hernia will protrude more certain times but is always reducible. She was also found to have an umbilical hernia by her primary care doctor. She has very rare shooting pain just left of midline from the pubis upwards, no pain related to the inguinal or umbilical hernias. Normal appetite, bowel movements are unchanged, she does express concern about weakness of the abdominal wall and the pelvic organs and has been worried about the exercises strengthening these together possible axillary hernia. She had an open left anal hernia repair by Dr. Brantley Stage in 2014 but no other abdominal surgeries. She works in Press photographer at [employer], does a lot of walking around, but no heavy lifting or strenuous activities.  Allergies  Allergen Reactions  . Sulfonamide Derivatives Itching    Past Medical History:  Diagnosis Date  . ADD (attention deficit disorder)   . Anxiety   . Depression   . Headache(784.0)   . Poor venous access     Past Surgical History:  Procedure Laterality Date  . HERNIA REPAIR    . INGUINAL HERNIA REPAIR Left 04/12/2013   Procedure:  REPAIR LEFT INGUINAL HERNIA;  Surgeon: Joyice Faster. Cornett, MD;  Location: [facility];  Service: General;  Laterality: Left;  . TONSILLECTOMY    . WISDOM TOOTH EXTRACTION      Family History  Problem Relation Age of Onset  . Cancer Maternal Grandmother        breast  . Cancer Paternal Grandfather        colon and lung  . Breast cancer Paternal Grandmother 76    Social History   Socioeconomic History  . Marital status: Married    Spouse name: Not on file  . Number of children: Not on file  . Years of education: Not on file  . Highest education level: Not on file  Occupational History  . Not on file   Social Needs  . Financial resource strain: Not on file  . Food insecurity    Worry: Not on file    Inability: Not on file  . Transportation needs    Medical: Not on file    Non-medical: Not on file  Tobacco Use  . Smoking status: Current Some Day Smoker    Packs/day: 0.25  . Smokeless tobacco: Never Used  Substance and Sexual Activity  . Alcohol use: No  . Drug use: No  . Sexual activity: Not on file  Lifestyle  . Physical activity    Days per week: Not on file    Minutes per session: Not on file  . Stress: Not on file  Relationships  . Social Herbalist on phone: Not on file    Gets together: Not on file    Attends religious service: Not on file    Active member of club or organization: Not on file    Attends meetings of clubs or organizations: Not on file    Relationship status: Not on file  Other Topics Concern  . Not on file  Social History Narrative  . Not on file    Current Outpatient Medications on File Prior to Visit  Medication Sig Dispense Refill  . amphetamine-dextroamphetamine (ADDERALL XR) 30 MG 24 hr capsule Adderall XR 30 mg capsule,extended release  TAKE ONE CAPSULE  BY MOUTH EVERY DAY    . ARIPiprazole (ABILIFY) 10 MG tablet aripiprazole 10 mg tablet  TAKE 1 TABLET BY MOUTH EVERY DAY    . atenolol (TENORMIN) 50 MG tablet atenolol 50 mg tablet  TAKE 1 TABLET BY MOUTH AT BEDTIME    . atorvastatin (LIPITOR) 40 MG tablet atorvastatin 40 mg tablet    . atorvastatin (LIPITOR) 40 MG tablet Take 40 mg by mouth daily.  5  . azithromycin (ZITHROMAX) 250 MG tablet azithromycin 250 mg tablet    . clonazePAM (KLONOPIN) 0.5 MG tablet Take 0.5 mg by mouth 2 (two) times daily as needed for anxiety.    Marland Kitchen doxycycline (VIBRA-TABS) 100 MG tablet doxycycline hyclate 100 mg tablet  TAKE 1 TABLET BY MOUTH TWICE A DAY FOR 7 DAYS    . DULoxetine (CYMBALTA) 60 MG capsule Take by mouth daily.  6  . eletriptan (RELPAX) 40 MG tablet TAKE 1 TABLET BY MOUTH AS DIRECTED  MAX OF 2 TABLET PER DAY  3  . fluconazole (DIFLUCAN) 150 MG tablet fluconazole 150 mg tablet    . fluticasone (FLONASE) 50 MCG/ACT nasal spray fluticasone 50 mcg/actuation nasal spray,suspension    . ibuprofen (ADVIL,MOTRIN) 800 MG tablet Take 800 mg by mouth every 8 (eight) hours as needed.    Marland Kitchen omeprazole (PRILOSEC) 20 MG capsule TAKE 1 CAPSULE BY MOUTH EVERY DAY BEFORE A MEAL  3  . PARoxetine (PAXIL) 20 MG tablet paroxetine 20 mg tablet  TAKE 1 TABLET BY MOUTH EVERY DAY FOR 1 WEEK THEN TAKE 2 TABLETS BY MOUTH EVERY DAY FOR 3 WEEKS    . predniSONE (DELTASONE) 50 MG tablet 1 tab po daily for 6 days. Take with food. 6 tablet 0  . rizatriptan (MAXALT) 10 MG tablet Take 10 mg by mouth as needed for migraine. May repeat in 2 hours if needed    . sertraline (ZOLOFT) 100 MG tablet Take 150 mg by mouth at bedtime.     . valACYclovir (VALTREX) 500 MG tablet valacyclovir 500 mg tablet     No current facility-administered medications on file prior to visit.     Review of Systems: General Present- Fatigue and Night Sweats. Not Present- Appetite Loss, Chills, Fever, Weight Gain and Weight Loss. Skin Present- Non-Healing Wounds. Not Present- Change in Wart/Mole, Dryness, Hives, Jaundice, New Lesions, Rash and Ulcer. HEENT Present- Earache, Ringing in the Ears and Sinus Pain. Not Present- Hearing Loss, Hoarseness, Nose Bleed, Oral Ulcers, Seasonal Allergies, Sore Throat, Visual Disturbances, Wears glasses/contact lenses and Yellow Eyes. Respiratory Not Present- Bloody sputum, Chronic Cough, Difficulty Breathing, Snoring and Wheezing. Breast Not Present- Breast Mass, Breast Pain, Nipple Discharge and Skin Changes. Cardiovascular Not Present- Chest Pain, Difficulty Breathing Lying Down, Leg Cramps, Palpitations, Rapid Heart Rate, Shortness of Breath and Swelling of Extremities. Gastrointestinal Present- Bloating and Change in Bowel Habits. Not Present- Abdominal Pain, Bloody Stool, Chronic diarrhea,  Constipation, Difficulty Swallowing, Excessive gas, Gets full quickly at meals, Hemorrhoids, Indigestion, Nausea, Rectal Pain and Vomiting. Female Genitourinary Not Present- Frequency, Nocturia, Painful Urination, Pelvic Pain and Urgency. Musculoskeletal Present- Back Pain and Joint Pain. Not Present- Joint Stiffness, Muscle Pain, Muscle Weakness and Swelling of Extremities. Neurological Present- Headaches. Not Present- Decreased Memory, Fainting, Numbness, Seizures, Tingling, Tremor, Trouble walking and Weakness. Psychiatric Present- Anxiety, Depression and Fearful. Not Present- Bipolar, Change in Sleep Pattern and Frequent crying. Endocrine Present- Cold Intolerance. Not Present- Excessive Hunger, Hair Changes, Heat Intolerance, Hot flashes and New Diabetes. Hematology Present- Easy Bruising. Not Present- Blood  Thinners, Excessive bleeding, Gland problems, HIV and Persistent Infections. All other systems negative  Physical Exam: Vitals Weight: 166.6 lb Height: 68in Body Surface Area: 1.89 m Body Mass Index: 25.33 kg/m  Temp.: 98.95F(Temporal)  Pulse: 94 (Regular)  BP: 138/74 (Sitting, Left Arm, Standard)  Gen: alert and well appearing Eye: extraocular motion intact, no scleral icterus ENT: moist mucus membranes, dentition intact Neck: no mass or thyromegaly Chest: unlabored respirations, symmetrical air entry, clear bilaterally CV: regular rate and rhythm, no pedal edema Abdomen: soft, nontender, nondistended. Chronically incarcerated fat at the umbilicus, no palpable fascial defect. There is a small reduced right inguinal hernia. No palpable recurrence of the left inguinal hernia. MSK: strength symmetrical throughout, no deformity Neuro: grossly intact, normal gait Psych: normal mood and affect, appropriate insight Skin: warm and dry, no rash or lesion on limited exam   CBC Latest Ref Rng & Units 04/10/2013 02/25/2009 02/23/2009  WBC 4.0 - 10.5 K/uL 7.6 8.7 9.2   Hemoglobin 12.0 - 15.0 g/dL 15.0 11.3(L) 12.7  Hematocrit 36.0 - 46.0 % 43.0 34.0(L) 38.1  Platelets 150 - 400 K/uL 185 204 228    CMP Latest Ref Rng & Units 04/10/2013 01/28/2010 12/26/2008  Glucose 70 - 99 mg/dL 76 93 84  BUN 6 - 23 mg/dL 11 12 5(L)  Creatinine 0.50 - 1.10 mg/dL 0.75 0.74 0.52  Sodium 135 - 145 mEq/L 140 140 137  Potassium 3.5 - 5.1 mEq/L 4.1 4.4 4.6  Chloride 96 - 112 mEq/L 100 105 108  CO2 19 - 32 mEq/L 30 23 21   Calcium 8.4 - 10.5 mg/dL 9.9 9.6 9.6  Total Protein 6.0 - 8.3 g/dL 7.7 6.9 6.1  Total Bilirubin 0.3 - 1.2 mg/dL 0.2(L) 0.2(L) 0.7  Alkaline Phos 39 - 117 U/L 59 50 62  AST 0 - 37 U/L 27 19 30   ALT 0 - 35 U/L 24 18 22     No results found for: INR, PROTIME  Imaging: No results found.   A/P: RIGHT INGUINAL HERNIA (K40.90) Story: Minimally symptomatic. We discussed options including ongoing observation with possible outcomes of no change/no symptoms, increasing size or pain from the hernia, and small risk of bowel incarceration. Alternately option of laparoscopic/ robotic repair and we discussed the technique as well as risks of surgery including bleeding, infection, pain, scarring, injury to intra-abdominal or retroperitoneal structures including bowel, bladder, blood vessels, nerves, discussed risk of hernia recurrence, use of mesh. This was allowed [place] to visualize the umbilicus- she had a CT scan several years ago that does show chronically inspissated fat at the umbilicus but there was no visible fascial defect there, but there is no fascial defect there is no benefit to intervening on this area- but if there is a partial defect we can repair this at the time. We can also assess the left-sided ensure no recurrence, if recurrence present can repair this at the same time. Questions were answered. She wishes to proceed with scheduling surgery.   Romana Juniper, MD [facility] Surgery, [place] Pager [phone]

## 2019-02-27 NOTE — Patient Instructions (Addendum)
DUE TO COVID-19 ONLY ONE VISITOR IS ALLOWED TO COME WITH YOU AND STAY IN THE WAITING ROOM ONLY DURING PRE OP AND PROCEDURE DAY OF SURGERY. THE 1 VISITOR MAY VISIT WITH YOU AFTER SURGERY IN YOUR PRIVATE ROOM DURING VISITING HOURS ONLY!  YOU NEED TO HAVE A COVID 19 TEST ON_______ @_______ , THIS TEST MUST BE DONE BEFORE SURGERY, COME  [place], [address] [place] , [id number].  ([place]) ONCE YOUR COVID TEST IS COMPLETED, PLEASE BEGIN THE QUARANTINE INSTRUCTIONS AS OUTLINED IN YOUR HANDOUT.                AVLYNN RAHAMAN  02/27/2019   Your procedure is scheduled on: 03-06-19   Report to [facility] Main  Entrance   Report to admitting at        1200    PM     Call this number if you have problems the morning of surgery [phone]    Remember: Do not eat food or drink liquids :After Midnight.  You may have clear liquids until 0800 am then nothing by mouth    CLEAR LIQUID DIET   Foods Allowed                                                                     Foods Excluded  Coffee and tea, regular and decaf                             liquids that you cannot  Plain Jell-O any favor except red or purple                                           see through such as: Fruit ices (not with fruit pulp)                                     milk, soups, orange juice  Iced Popsicles                                    All solid food Carbonated beverages, regular and diet                                    Cranberry, grape and apple juices Sports drinks like Gatorade Lightly seasoned clear broth or consume(fat free) Sugar, honey syrup   _____________________________________________________________________      BRUSH YOUR TEETH MORNING OF SURGERY AND RINSE YOUR MOUTH OUT, NO CHEWING GUM CANDY OR MINTS.     Take these medicines the morning of surgery with A SIP OF WATER: omeprazole, klonopin if needed                                 You may not have any  metal on your body including hair pins  and              piercings  Do not wear jewelry, make-up, lotions, powders or perfumes, deodorant             Do not wear nail polish on your fingernails.  Do not shave  48 hours prior to surgery.     Do not bring valuables to the hospital. [place].  Contacts, dentures or bridgework may not be worn into surgery.      Patients discharged the day of surgery will not be allowed to drive home. IF YOU ARE HAVING SURGERY AND GOING HOME THE SAME DAY, YOU MUST HAVE AN ADULT TO DRIVE YOU HOME AND BE WITH YOU FOR 24 HOURS. YOU MAY GO HOME BY TAXI OR UBER OR ORTHERWISE, BUT AN ADULT MUST ACCOMPANY YOU HOME AND STAY WITH YOU FOR 24 HOURS.  Name and phone number of your driver:  Special Instructions: N/A              Please read over the following fact sheets you were given: _____________________________________________________________________             [facility] - Preparing for Surgery Before surgery, you can play an important role.  Because skin is not sterile, your skin needs to be as free of germs as possible.  You can reduce the number of germs on your skin by washing with CHG (chlorahexidine gluconate) soap before surgery.  CHG is an antiseptic cleaner which kills germs and bonds with the skin to continue killing germs even after washing. Please DO NOT use if you have an allergy to CHG or antibacterial soaps.  If your skin becomes reddened/irritated stop using the CHG and inform your nurse when you arrive at Short Stay. Do not shave (including legs and underarms) for at least 48 hours prior to the first CHG shower.  You may shave your face/neck. Please follow these instructions carefully:  1.  Shower with CHG Soap the night before surgery and the  morning of Surgery.  2.  If you choose to wash your hair, wash your hair first as usual with your  normal  shampoo.  3.  After you shampoo, rinse your hair  and body thoroughly to remove the  shampoo.                           4.  Use CHG as you would any other liquid soap.  You can apply chg directly  to the skin and wash                       Gently with a scrungie or clean washcloth.  5.  Apply the CHG Soap to your body ONLY FROM THE NECK DOWN.   Do not use on face/ open                           Wound or open sores. Avoid contact with eyes, ears mouth and genitals (private parts).                       Wash face,  Genitals (private parts) with your normal soap.             6.  Wash thoroughly, paying special attention  to the area where your surgery  will be performed.  7.  Thoroughly rinse your body with warm water from the neck down.  8.  DO NOT shower/wash with your normal soap after using and rinsing off  the CHG Soap.                9.  Pat yourself dry with a clean towel.            10.  Wear clean pajamas.            11.  Place clean sheets on your bed the night of your first shower and do not  sleep with pets. Day of Surgery : Do not apply any lotions/deodorants the morning of surgery.  Please wear clean clothes to the hospital/surgery center.  FAILURE TO FOLLOW THESE INSTRUCTIONS MAY RESULT IN THE CANCELLATION OF YOUR SURGERY PATIENT SIGNATURE_________________________________  NURSE SIGNATURE__________________________________  ________________________________________________________________________

## 2019-02-27 NOTE — Progress Notes (Signed)
PCP - Vania Rea Cardiologist -   Chest x-ray -  EKG -  Stress Test -  ECHO -  Cardiac Cath -   Sleep Study -  CPAP -   Fasting Blood Sugar -  Checks Blood Sugar _____ times a day  Blood Thinner Instructions: Aspirin Instructions: Last Dose:  Anesthesia review:   Patient denies shortness of breath, fever, cough and chest pain at PAT appointment   none   Patient verbalized understanding of instructions that were given to them at the PAT appointment. Patient was also instructed that they will need to review over the PAT instructions again at home before surgery. note

## 2019-02-28 ENCOUNTER — Encounter (HOSPITAL_COMMUNITY): Payer: Self-pay

## 2019-02-28 ENCOUNTER — Other Ambulatory Visit: Payer: Self-pay

## 2019-02-28 ENCOUNTER — Encounter (HOSPITAL_COMMUNITY)
Admission: RE | Admit: 2019-02-28 | Discharge: 2019-02-28 | Disposition: A | Discharge status: Home / Self Care | Payer: 59 | Source: Ambulatory Visit | Attending: Surgery | Admitting: Surgery

## 2019-02-28 DIAGNOSIS — Z01812 Encounter for preprocedural laboratory examination: Secondary | ICD-10-CM | POA: Diagnosis not present

## 2019-02-28 DIAGNOSIS — K429 Umbilical hernia without obstruction or gangrene: Secondary | ICD-10-CM | POA: Insufficient documentation

## 2019-02-28 HISTORY — DX: Other complications of anesthesia, initial encounter: T88.59XA

## 2019-02-28 LAB — CBC
HCT: 42.3 % (ref 36.0–46.0)
Hemoglobin: 13.7 g/dL (ref 12.0–15.0)
MCH: 29.7 pg (ref 26.0–34.0)
MCHC: 32.4 g/dL (ref 30.0–36.0)
MCV: 91.8 fL (ref 80.0–100.0)
Platelets: 214 10*3/uL (ref 150–400)
RBC: 4.61 MIL/uL (ref 3.87–5.11)
RDW: 12.9 % (ref 11.5–15.5)
WBC: 9.7 10*3/uL (ref 4.0–10.5)
nRBC: 0 % (ref 0.0–0.2)

## 2019-03-02 ENCOUNTER — Other Ambulatory Visit: Payer: Self-pay

## 2019-03-02 ENCOUNTER — Other Ambulatory Visit (HOSPITAL_COMMUNITY)
Admission: RE | Admit: 2019-03-02 | Discharge: 2019-03-02 | Disposition: A | Discharge status: Home / Self Care | Payer: 59 | Source: Ambulatory Visit | Attending: Surgery | Admitting: Surgery

## 2019-03-02 DIAGNOSIS — Z20828 Contact with and (suspected) exposure to other viral communicable diseases: Secondary | ICD-10-CM | POA: Insufficient documentation

## 2019-03-02 DIAGNOSIS — Z01812 Encounter for preprocedural laboratory examination: Secondary | ICD-10-CM | POA: Insufficient documentation

## 2019-03-03 LAB — NOVEL CORONAVIRUS, NAA (HOSP ORDER, SEND-OUT TO REF LAB; TAT 18-24 HRS): SARS-CoV-2, NAA: NOT DETECTED

## 2019-03-05 MED ORDER — BUPIVACAINE LIPOSOME 1.3 % IJ SUSP
20.0000 mL | Freq: Once | INTRAMUSCULAR | Status: DC
  Filled 2019-03-05: qty 20

## 2019-03-06 ENCOUNTER — Encounter (HOSPITAL_COMMUNITY): Payer: Self-pay

## 2019-03-06 ENCOUNTER — Ambulatory Visit (HOSPITAL_COMMUNITY): Payer: 59 | Admitting: Anesthesiology

## 2019-03-06 ENCOUNTER — Ambulatory Visit (HOSPITAL_COMMUNITY): Payer: 59

## 2019-03-06 ENCOUNTER — Ambulatory Visit (HOSPITAL_COMMUNITY)
Admission: RE | Admit: 2019-03-06 | Discharge: 2019-03-06 | Disposition: A | Discharge status: Home / Self Care | Payer: 59 | Attending: Surgery | Admitting: Surgery

## 2019-03-06 ENCOUNTER — Encounter (HOSPITAL_COMMUNITY)
Admission: RE | Disposition: A | Discharge status: Home / Self Care | Payer: Self-pay | Source: Home / Self Care | Attending: Surgery

## 2019-03-06 DIAGNOSIS — E7800 Pure hypercholesterolemia, unspecified: Secondary | ICD-10-CM | POA: Diagnosis not present

## 2019-03-06 DIAGNOSIS — D1779 Benign lipomatous neoplasm of other sites: Secondary | ICD-10-CM | POA: Diagnosis not present

## 2019-03-06 DIAGNOSIS — Z882 Allergy status to sulfonamides status: Secondary | ICD-10-CM | POA: Insufficient documentation

## 2019-03-06 DIAGNOSIS — K420 Umbilical hernia with obstruction, without gangrene: Secondary | ICD-10-CM | POA: Insufficient documentation

## 2019-03-06 DIAGNOSIS — F988 Other specified behavioral and emotional disorders with onset usually occurring in childhood and adolescence: Secondary | ICD-10-CM | POA: Insufficient documentation

## 2019-03-06 DIAGNOSIS — F329 Major depressive disorder, single episode, unspecified: Secondary | ICD-10-CM | POA: Diagnosis not present

## 2019-03-06 DIAGNOSIS — K4030 Unilateral inguinal hernia, with obstruction, without gangrene, not specified as recurrent: Secondary | ICD-10-CM | POA: Diagnosis not present

## 2019-03-06 DIAGNOSIS — Z79899 Other long term (current) drug therapy: Secondary | ICD-10-CM | POA: Diagnosis not present

## 2019-03-06 DIAGNOSIS — F419 Anxiety disorder, unspecified: Secondary | ICD-10-CM | POA: Diagnosis not present

## 2019-03-06 DIAGNOSIS — F17210 Nicotine dependence, cigarettes, uncomplicated: Secondary | ICD-10-CM | POA: Insufficient documentation

## 2019-03-06 HISTORY — PX: XI ROBOTIC ASSISTED INGUINAL HERNIA REPAIR WITH MESH: SHX6706

## 2019-03-06 LAB — PREGNANCY, URINE: Preg Test, Ur: NEGATIVE

## 2019-03-06 SURGERY — REPAIR, HERNIA, INGUINAL, ROBOT-ASSISTED, LAPAROSCOPIC, USING MESH
Anesthesia: General | Site: Abdomen

## 2019-03-06 MED ORDER — DEXAMETHASONE SODIUM PHOSPHATE 10 MG/ML IJ SOLN
INTRAMUSCULAR | Status: DC | PRN
  Administered 2019-03-06: 5 mg via INTRAVENOUS

## 2019-03-06 MED ORDER — LIDOCAINE 2% (20 MG/ML) 5 ML SYRINGE
INTRAMUSCULAR | Status: DC | PRN
  Administered 2019-03-06: 100 mg via INTRAVENOUS

## 2019-03-06 MED ORDER — DOCUSATE SODIUM 100 MG PO CAPS: 100.0000 mg | ORAL_CAPSULE | Freq: Two times a day (BID) | ORAL | 0 refills | Status: AC

## 2019-03-06 MED ORDER — ROCURONIUM BROMIDE 10 MG/ML (PF) SYRINGE
PREFILLED_SYRINGE | INTRAVENOUS | Status: AC
  Filled 2019-03-06: qty 10

## 2019-03-06 MED ORDER — MIDAZOLAM HCL 2 MG/2ML IJ SOLN
INTRAMUSCULAR | Status: AC
  Filled 2019-03-06: qty 2

## 2019-03-06 MED ORDER — 0.9 % SODIUM CHLORIDE (POUR BTL) OPTIME
TOPICAL | Status: DC | PRN
  Administered 2019-03-06: 15:00:00 1000 mL

## 2019-03-06 MED ORDER — LACTATED RINGERS IV SOLN
INTRAVENOUS | Status: DC
  Administered 2019-03-06 (×2): via INTRAVENOUS

## 2019-03-06 MED ORDER — ONDANSETRON HCL 4 MG/2ML IJ SOLN
INTRAMUSCULAR | Status: DC | PRN
  Administered 2019-03-06: 4 mg via INTRAVENOUS

## 2019-03-06 MED ORDER — PROPOFOL 10 MG/ML IV BOLUS
INTRAVENOUS | Status: DC | PRN
  Administered 2019-03-06: 200 mg via INTRAVENOUS

## 2019-03-06 MED ORDER — CHLORHEXIDINE GLUCONATE 4 % EX LIQD: 60.0000 mL | Freq: Once | CUTANEOUS | Status: DC

## 2019-03-06 MED ORDER — FENTANYL CITRATE (PF) 100 MCG/2ML IJ SOLN
INTRAMUSCULAR | Status: AC
  Filled 2019-03-06: qty 2

## 2019-03-06 MED ORDER — DEXAMETHASONE SODIUM PHOSPHATE 10 MG/ML IJ SOLN
INTRAMUSCULAR | Status: AC
  Filled 2019-03-06: qty 1

## 2019-03-06 MED ORDER — MIDAZOLAM HCL 2 MG/2ML IJ SOLN
INTRAMUSCULAR | Status: DC | PRN
  Administered 2019-03-06: 2 mg via INTRAVENOUS

## 2019-03-06 MED ORDER — OXYCODONE HCL 5 MG PO TABS: 5.0000 mg | ORAL_TABLET | Freq: Once | ORAL | Status: DC | PRN

## 2019-03-06 MED ORDER — GLYCOPYRROLATE PF 0.2 MG/ML IJ SOSY
PREFILLED_SYRINGE | INTRAMUSCULAR | Status: AC
  Filled 2019-03-06: qty 2

## 2019-03-06 MED ORDER — OXYCODONE HCL 5 MG/5ML PO SOLN: 5.0000 mg | Freq: Once | ORAL | Status: DC | PRN

## 2019-03-06 MED ORDER — GABAPENTIN 300 MG PO CAPS
300.0000 mg | ORAL_CAPSULE | ORAL | Status: AC
  Administered 2019-03-06: 300 mg via ORAL
  Filled 2019-03-06: qty 1

## 2019-03-06 MED ORDER — LIDOCAINE 2% (20 MG/ML) 5 ML SYRINGE
INTRAMUSCULAR | Status: AC
  Filled 2019-03-06: qty 5

## 2019-03-06 MED ORDER — ROCURONIUM BROMIDE 10 MG/ML (PF) SYRINGE
PREFILLED_SYRINGE | INTRAVENOUS | Status: DC | PRN
  Administered 2019-03-06: 20 mg via INTRAVENOUS
  Administered 2019-03-06: 60 mg via INTRAVENOUS

## 2019-03-06 MED ORDER — OXYCODONE HCL 5 MG PO TABS: 5.0000 mg | ORAL_TABLET | Freq: Four times a day (QID) | ORAL | 0 refills | Status: AC | PRN

## 2019-03-06 MED ORDER — BUPIVACAINE HCL (PF) 0.25 % IJ SOLN
INTRAMUSCULAR | Status: DC | PRN
  Administered 2019-03-06: 30 mL

## 2019-03-06 MED ORDER — FENTANYL CITRATE (PF) 100 MCG/2ML IJ SOLN: 25.0000 ug | INTRAMUSCULAR | Status: DC | PRN

## 2019-03-06 MED ORDER — LIDOCAINE 20MG/ML (2%) 15 ML SYRINGE OPTIME
INTRAMUSCULAR | Status: DC | PRN
  Administered 2019-03-06: 1.5 mg/kg/h via INTRAVENOUS

## 2019-03-06 MED ORDER — PROMETHAZINE HCL 25 MG/ML IJ SOLN: 6.2500 mg | INTRAMUSCULAR | Status: DC | PRN

## 2019-03-06 MED ORDER — GLYCOPYRROLATE PF 0.2 MG/ML IJ SOSY
PREFILLED_SYRINGE | INTRAMUSCULAR | Status: DC | PRN
  Administered 2019-03-06: .4 mg via INTRAVENOUS

## 2019-03-06 MED ORDER — ONDANSETRON HCL 4 MG/2ML IJ SOLN
INTRAMUSCULAR | Status: AC
  Filled 2019-03-06: qty 2

## 2019-03-06 MED ORDER — PHENYLEPHRINE 40 MCG/ML (10ML) SYRINGE FOR IV PUSH (FOR BLOOD PRESSURE SUPPORT)
PREFILLED_SYRINGE | INTRAVENOUS | Status: AC
  Filled 2019-03-06: qty 10

## 2019-03-06 MED ORDER — PHENYLEPHRINE 40 MCG/ML (10ML) SYRINGE FOR IV PUSH (FOR BLOOD PRESSURE SUPPORT)
PREFILLED_SYRINGE | INTRAVENOUS | Status: DC | PRN
  Administered 2019-03-06 (×4): 80 ug via INTRAVENOUS

## 2019-03-06 MED ORDER — FENTANYL CITRATE (PF) 250 MCG/5ML IJ SOLN
INTRAMUSCULAR | Status: AC
  Filled 2019-03-06: qty 5

## 2019-03-06 MED ORDER — SUGAMMADEX SODIUM 200 MG/2ML IV SOLN
INTRAVENOUS | Status: DC | PRN
  Administered 2019-03-06: 150 mg via INTRAVENOUS

## 2019-03-06 MED ORDER — CEFAZOLIN SODIUM-DEXTROSE 2-4 GM/100ML-% IV SOLN
2.0000 g | INTRAVENOUS | Status: AC
  Administered 2019-03-06: 2 g via INTRAVENOUS
  Filled 2019-03-06: qty 100

## 2019-03-06 MED ORDER — BUPIVACAINE LIPOSOME 1.3 % IJ SUSP
INTRAMUSCULAR | Status: DC | PRN
  Administered 2019-03-06: 20 mL

## 2019-03-06 MED ORDER — ACETAMINOPHEN 500 MG PO TABS
1000.0000 mg | ORAL_TABLET | ORAL | Status: AC
  Administered 2019-03-06: 1000 mg via ORAL
  Filled 2019-03-06: qty 2

## 2019-03-06 MED ORDER — GLYCOPYRROLATE PF 0.2 MG/ML IJ SOSY
PREFILLED_SYRINGE | INTRAMUSCULAR | Status: AC
  Filled 2019-03-06: qty 1

## 2019-03-06 MED ORDER — LIDOCAINE HCL 2 % IJ SOLN
INTRAMUSCULAR | Status: AC
  Filled 2019-03-06: qty 20

## 2019-03-06 MED ORDER — FENTANYL CITRATE (PF) 250 MCG/5ML IJ SOLN
INTRAMUSCULAR | Status: DC | PRN
  Administered 2019-03-06 (×3): 50 ug via INTRAVENOUS
  Administered 2019-03-06: 100 ug via INTRAVENOUS

## 2019-03-06 MED ORDER — BUPIVACAINE HCL (PF) 0.25 % IJ SOLN
INTRAMUSCULAR | Status: AC
  Filled 2019-03-06: qty 30

## 2019-03-06 MED ORDER — PROPOFOL 10 MG/ML IV BOLUS
INTRAVENOUS | Status: AC
  Filled 2019-03-06: qty 20

## 2019-03-06 SURGICAL SUPPLY — 49 items
APPLICATOR COTTON TIP 6 STRL (MISCELLANEOUS) ×2 IMPLANT
APPLICATOR COTTON TIP 6IN STRL (MISCELLANEOUS) ×6
BLADE SURG SZ11 CARB STEEL (BLADE) ×3 IMPLANT
CHLORAPREP W/TINT 26 (MISCELLANEOUS) ×3 IMPLANT
COVER SURGICAL LIGHT HANDLE (MISCELLANEOUS) ×3 IMPLANT
COVER TIP SHEARS 8 DVNC (MISCELLANEOUS) ×1 IMPLANT
COVER TIP SHEARS 8MM DA VINCI (MISCELLANEOUS) ×2
COVER WAND RF STERILE (DRAPES) IMPLANT
DECANTER SPIKE VIAL GLASS SM (MISCELLANEOUS) ×3 IMPLANT
DERMABOND ADVANCED (GAUZE/BANDAGES/DRESSINGS) ×2
DERMABOND ADVANCED .7 DNX12 (GAUZE/BANDAGES/DRESSINGS) IMPLANT
DRAPE ARM DVNC X/XI (DISPOSABLE) ×4 IMPLANT
DRAPE COLUMN DVNC XI (DISPOSABLE) ×1 IMPLANT
DRAPE DA VINCI XI ARM (DISPOSABLE) ×8
DRAPE DA VINCI XI COLUMN (DISPOSABLE) ×2
ELECT REM PT RETURN 15FT ADLT (MISCELLANEOUS) ×3 IMPLANT
GLOVE BIO SURGEON STRL SZ 6 (GLOVE) ×6 IMPLANT
GLOVE INDICATOR 6.5 STRL GRN (GLOVE) ×6 IMPLANT
GOWN STRL REUS W/TWL LRG LVL3 (GOWN DISPOSABLE) ×6 IMPLANT
GOWN STRL REUS W/TWL XL LVL3 (GOWN DISPOSABLE) ×6 IMPLANT
IRRIG SUCT STRYKERFLOW 2 WTIP (MISCELLANEOUS)
IRRIGATION SUCT STRKRFLW 2 WTP (MISCELLANEOUS) ×1 IMPLANT
KIT BASIN OR (CUSTOM PROCEDURE TRAY) ×3 IMPLANT
KIT TURNOVER KIT A (KITS) IMPLANT
MARKER SKIN DUAL TIP RULER LAB (MISCELLANEOUS) ×2 IMPLANT
MESH ULTRAPRO 3X6 7.6X15CM (Mesh General) ×2 IMPLANT
NDL INSUFFLATION 14GA 120MM (NEEDLE) IMPLANT
NEEDLE HYPO 22GX1.5 SAFETY (NEEDLE) ×3 IMPLANT
NEEDLE INSUFFLATION 14GA 120MM (NEEDLE) IMPLANT
PACK CARDIOVASCULAR III (CUSTOM PROCEDURE TRAY) ×3 IMPLANT
PAD POSITIONING PINK XL (MISCELLANEOUS) ×3 IMPLANT
SCISSORS LAP 5X35 DISP (ENDOMECHANICALS) ×3 IMPLANT
SEAL CANN UNIV 5-8 DVNC XI (MISCELLANEOUS) ×3 IMPLANT
SEAL XI 5MM-8MM UNIVERSAL (MISCELLANEOUS) ×6
SET TUBE SMOKE EVAC HIGH FLOW (TUBING) ×3 IMPLANT
SOL ANTI FOG 6CC (MISCELLANEOUS) ×1 IMPLANT
SOLUTION ANTI FOG 6CC (MISCELLANEOUS) ×2
SOLUTION ELECTROLUBE (MISCELLANEOUS) ×3 IMPLANT
SPONGE LAP 18X18 RF (DISPOSABLE) ×3 IMPLANT
SUT ETHIBOND 0 MO6 C/R (SUTURE) ×2 IMPLANT
SUT MNCRL AB 4-0 PS2 18 (SUTURE) ×3 IMPLANT
SUT VIC AB 3-0 SH 27 (SUTURE) ×8
SUT VIC AB 3-0 SH 27XBRD (SUTURE) ×2 IMPLANT
SUT VIC AB 4-0 SH 18 (SUTURE) ×3 IMPLANT
SUT VLOC 180 2-0 6IN GS21 (SUTURE) IMPLANT
SYR 20ML LL LF (SYRINGE) ×3 IMPLANT
TOWEL OR 17X26 10 PK STRL BLUE (TOWEL DISPOSABLE) ×3 IMPLANT
TOWEL OR NON WOVEN STRL DISP B (DISPOSABLE) ×3 IMPLANT
TROCAR BLADELESS OPT 5 100 (ENDOMECHANICALS) ×3 IMPLANT

## 2019-03-06 NOTE — Anesthesia Procedure Notes (Signed)
Procedure Name: Intubation Date/Time: 03/06/2019 2:23 PM Performed by: Lollie Sails, CRNA Pre-anesthesia Checklist: Patient identified, Emergency Drugs available, Suction available, Patient being monitored and Timeout performed Patient Re-evaluated:Patient Re-evaluated prior to induction Oxygen Delivery Method: Circle system utilized Preoxygenation: Pre-oxygenation with 100% oxygen Induction Type: IV induction Ventilation: Mask ventilation without difficulty Laryngoscope Size: Miller and 2 Grade View: Grade I Tube type: Oral Tube size: 7.5 mm Number of attempts: 1 Airway Equipment and Method: Stylet Placement Confirmation: ETT inserted through vocal cords under direct vision,  positive ETCO2 and breath sounds checked- equal and bilateral Secured at: 22 cm Tube secured with: Tape Dental Injury: Teeth and Oropharynx as per pre-operative assessment

## 2019-03-06 NOTE — Op Note (Signed)
Operative Note  LAQUANTA DIMANCHE  AG:[id number]  [id number]  03/06/2019   Surgeon: Vikki Ports A ConnorMD  Assistant: OR staff  Procedure performed: Robotic transabdominal preperitoneal repair of right direct and indirect inguinal hernia and excision of large retroperitoneal lipoma, primary repair of chronically incarcerated umbilical hernia  Preop diagnosis: Right inguinal hernia, incarcerated umbilical hernia Post-op diagnosis/intraop findings: Small direct hernia containing incarcerated fat, moderate indirect hernia containing a large retroperitoneal lipoma, subcentimeter umbilical hernia containing incarcerated fat.  Prior left inguinal hernia repair is intact without any complication.    Specimens: none Retained items: None EBL: Minimal cc Complications: none  Description of procedure: After obtaining informed consent the patient was taken to the operating room and placed supine on operating room table wheregeneral endotracheal anesthesia was initiated, preoperative antibiotics were administered, SCDs applied, and a formal timeout was performed.  Foley catheter was inserted which is removed at the end of the case.  The patient's abdomen was prepped and draped in usual sterile fashion.  After infiltration with local, a curvilinear infraumbilical incision was made and the soft tissues dissected bluntly and with cautery to separate the umbilical skin from the underlying hernia sac which was skeletonized and then excised, along with its chronically incarcerated fat, at the level of the fascia where there was a subcentimeter fascial defect.  A 8 mm robotic trocar was inserted through this defect and the abdomen was insufflated to 15 mmHg without incident.  Camera was inserted and gross inspection confirmed no evidence of injury from our entry.  The pelvis is inspected, the left hernia repair appears intact without complicating features, there is a simple appearing left ovarian cyst, and right  inguinal hernia is confirmed.  Under direct visualization and after infiltration with local, bilateral 8 mm trochars were inserted.  A right sided laparoscopic-assisted tap block was performed using Exparel mixed with quarter percent Marcaine.  The robot was then docked and robotic instruments inserted under direct visualization.  A peritoneal flap was then developed spanning from the anterior superior iliac spine to the medial umbilical ligament.  The direct hernia sac was reduced and a moderate amount of incarcerated fat was reduced from the direct hernia defect.  Dissection proceeded until the Cooper's ligament and pubic tubercle were exposed in sufficient room had been created inferiorly for the mesh.  The round ligament was cauterized and then divided.  There was an occult indirect hernia as well and a large retroperitoneal lipoma was reduced from the indirect space and followed proximally to a point where it could be divided from the native retroperitoneal fat with cautery.  Hemostasis was ensured.  The lipoma was removed and discarded.  At this point a 3 x 6 ultra Pro mesh was brought onto the field and trimmed slightly and inserted.  This was placed over the hernia defects and noted to have more than 5 cm of circumferential overlap.  This was secured to the Cooper's ligament as well as to the abdominal wall superiorly on either side of the inferior epigastric vessels with interrupted 3-0 Vicryls.  The peritoneal flap was then brought back up to cover the mesh, ensuring no buckling or folding of the mesh while doing so.  The peritoneum was reapproximated with a running 3-0 Vicryl.  Upon conclusion the mesh remains completely flush over the indirect and direct hernia defects and is completely covered with peritoneum.  The abdomen was then once again surveyed and hemostasis confirmed.  The umbilical trocar was removed and the umbilical hernia defect  closed transversely with interrupted 0 Ethibonds.  The  abdomen was reinsufflated and the umbilical hernia repair inspected, confirming that it is airtight with no entrapped viscera.  At this point the abdomen is desufflated and the remaining trochars removed.  The umbilical skin is tacked back down to the fascia with a 3-0 Vicryl.  The skin incisions are closed with subcuticular Monocryl and Dermabond.  The patient was then awakened, extubated and taken to PACU in stable condition.   All counts were correct at the completion of the case.

## 2019-03-06 NOTE — Anesthesia Postprocedure Evaluation (Signed)
Anesthesia Post Note  Patient: Cindy Deleon  Procedure(s) Performed: XI ROBOTIC ASSISTED RIGHT  INGUINAL HERNIA REPAIR WITH MESH, AND UMBILICAL HERNIA REPAIR (N/A Abdomen)     Patient location during evaluation: PACU Anesthesia Type: General Level of consciousness: awake and alert Pain management: pain level controlled Vital Signs Assessment: post-procedure vital signs reviewed and stable Respiratory status: spontaneous breathing, nonlabored ventilation and respiratory function stable Cardiovascular status: blood pressure returned to baseline and stable Postop Assessment: no apparent nausea or vomiting Anesthetic complications: no    Last Vitals:  Vitals:   03/06/19 1700 03/06/19 1730  BP: 107/75 103/63  Pulse: 79 80  Resp: 16 18  Temp: (!) 36.3 C 36.4 C  SpO2: 98% 100%    Last Pain:  Vitals:   03/06/19 1730  TempSrc:   PainSc: 0-No pain                 Audry Pili

## 2019-03-06 NOTE — Discharge Instructions (Signed)
HERNIA REPAIR: POST OP INSTRUCTIONS  ######################################################################  EAT Gradually transition to a high fiber diet with a fiber supplement over the next few weeks after discharge.  Start with a pureed / full liquid diet (see below)  WALK Walk an hour a day.  Control your pain to do that.    CONTROL PAIN Control pain so that you can walk, sleep, tolerate sneezing/coughing, and go up/down stairs.  HAVE A BOWEL MOVEMENT DAILY Keep your bowels regular to avoid problems.  OK to try a laxative to override constipation.  OK to use an antidairrheal to slow down diarrhea.  Call if not better after 2 tries  CALL IF YOU HAVE PROBLEMS/CONCERNS Call if you are still struggling despite following these instructions. Call if you have concerns not answered by these instructions  ######################################################################    1. DIET: Follow a light bland diet & liquids the first 24 hours after arrival home, such as soup, liquids, starches, etc.  Be sure to drink plenty of fluids.  Quickly advance to a usual solid diet within a few days.  Avoid fast food or heavy meals as your are more likely to get nauseated or have irregular bowels.  A low-sugar, high-fiber diet for the rest of your life is ideal.   2. Take your usually prescribed home medications unless otherwise directed.  3. PAIN CONTROL: a. Pain is best controlled by a usual combination of three different methods TOGETHER: i. Ice/Heat ii. Over the counter pain medication iii. Prescription pain medication b. Most patients will experience some swelling and bruising around the hernia(s) such as the bellybutton, groins, or old incisions.  Ice packs or heating pads (30-60 minutes up to 6 times a day) will help. Use ice for the first few days to help decrease swelling and bruising, then switch to heat to help relax tight/sore spots and speed recovery.  Some people prefer to use ice  alone, heat alone, alternating between ice & heat.  Experiment to what works for you.  Swelling and bruising can take several weeks to resolve.   c. It is helpful to take an over-the-counter pain medication regularly for the first few days:          -Naproxen (Aleve, etc)  Two 220mg  tabs twice a day OR Ibuprofen (Advil, etc) Three 200mg  tabs four times a day (every meal & bedtime) AND Acetaminophen (Tylenol, etc) 325-650mg  four times a day (every meal & bedtime) d. A  prescription for pain medication should be given to you upon discharge.  Take your pain medication as prescribed, if needed.  i. If you are having problems/concerns with the prescription medicine (does not control pain, nausea, vomiting, rash, itching, etc), please call us [phone] to see if we need to switch you to a different pain medicine that will work better for you and/or control your side effect better. ii. If you need a refill on your pain medication, please contact your pharmacy.  They will contact our office to request authorization. Prescriptions will not be filled after 5 pm or on week-ends.  4. Avoid getting constipated.  Between the surgery and the pain medications, it is common to experience some constipation.  Increasing fluid intake and taking a fiber supplement (such as Metamucil, Citrucel, FiberCon, MiraLax, etc) 1-2 times a day regularly will usually help prevent this problem from occurring.  A mild laxative (prune juice, Milk of Magnesia, MiraLax, etc) should be taken according to package directions if there are no bowel movements after 48 hours.  5. Wash / shower every day.  You may shower over the skin glue which is waterproof.    6. Glue will flake off after 1-2 weeks. You may replace a dressing/Band-Aid to cover the incision for comfort if you wish. You may leave the incisions open to air.  You may replace a dressing/Band-Aid to cover an incision for comfort if you wish.  Continue to shower over incision(s)  after the dressing is off.  7. ACTIVITIES as tolerated:   a. You may resume regular (light) daily activities beginning the next day--such as daily self-care, walking, climbing stairs--gradually increasing activities as tolerated.  Control your pain so that you can walk an hour a day.  If you can walk 30 minutes without difficulty, it is safe to try more intense activity such as jogging, treadmill, bicycling, low-impact aerobics, swimming, etc. b. Refrain from the most intensive and strenuous activity such as sit-ups, heavy lifting, contact sports, etc  Refrain from any heavy lifting or straining until 6 weeks after surgery   c. DO NOT PUSH THROUGH PAIN.  Let pain be your guide: If it hurts to do something, don't do it.  Pain is your body warning you to avoid that activity for another week until the pain goes down. d. You may drive when you are no longer taking prescription pain medication, you can comfortably wear a seatbelt, and you can safely maneuver your car and apply brakes. e. Dennis Bast may have sexual intercourse when it is comfortable.   8. FOLLOW UP in our office a. Please call [facility] at [phone] to set up an appointment to see your surgeon in the office for a follow-up appointment approximately 2-3 weeks after your surgery. b. Make sure that you call for this appointment the day you arrive home to insure a convenient appointment time.  9.  If you have disability of FMLA / Family leave forms, please bring the forms to the office for processing.  (do not give to your surgeon).  WHEN TO CALL us [phone]: 1. Poor pain control 2. Reactions / problems with new medications (rash/itching, nausea, etc)  3. Fever over 101.5 F (38.5 C) 4. Inability to urinate 5. Nausea and/or vomiting 6. Worsening swelling or bruising 7. Continued bleeding from incision. 8. Increased pain, redness, or drainage from the incision   The clinic staff is available to answer your questions during regular  business hours (8:30am-5pm).  Please dont hesitate to call and ask to speak to one of our nurses for clinical concerns.   If you have a medical emergency, go to the nearest emergency room or call 911.  A surgeon from [facility] is always on call at the hospitals in [facility], [place], [place], [place], [place]  [address] ?  [address] [address], [place], [place]   [address] MAIN: [phone] ? TOLL FREE: [phone] ? FAX: [phone] [email or web address]

## 2019-03-06 NOTE — Interval H&P Note (Signed)
History and Physical Interval Note:  03/06/2019 12:58 PM  Cindy Deleon  has presented today for surgery, with the diagnosis of HERNIA.  The various methods of treatment have been discussed with the patient and family. After consideration of risks, benefits and other options for treatment, the patient has consented to  Procedure(s): XI ROBOTIC [place] (N/A) as a surgical intervention.  The patient's history has been reviewed, patient examined, no change in status, stable for surgery.  I have reviewed the patient's chart and labs.  Questions were answered to the patient's satisfaction.     Mily Malecki Rich Brave

## 2019-03-06 NOTE — Anesthesia Preprocedure Evaluation (Addendum)
Anesthesia Evaluation  Patient identified by MRN, date of birth, ID band Patient awake    Reviewed: Allergy & Precautions, NPO status , Patient's Chart, lab work & pertinent test results  History of Anesthesia Complications Negative for: history of anesthetic complications  Airway Mallampati: II  TM Distance: >3 FB Neck ROM: Full    Dental  (+) Dental Advisory Given, Chipped,    Pulmonary Current Smoker and Patient abstained from smoking.,    Pulmonary exam normal        Cardiovascular negative cardio ROS Normal cardiovascular exam     Neuro/Psych  Headaches, PSYCHIATRIC DISORDERS Anxiety Depression    GI/Hepatic Neg liver ROS, GERD  Medicated and Controlled,  Endo/Other  negative endocrine ROS  Renal/GU negative Renal ROS     Musculoskeletal negative musculoskeletal ROS (+)   Abdominal   Peds  (+) ATTENTION DEFICIT DISORDER WITHOUT HYPERACTIVITY Hematology negative hematology ROS (+)   Anesthesia Other Findings Sjogren's syndrome   Reproductive/Obstetrics                            Anesthesia Physical Anesthesia Plan  ASA: II  Anesthesia Plan: General   Post-op Pain Management:    Induction: Intravenous  PONV Risk Score and Plan: 3 and Treatment may vary due to age or medical condition, Ondansetron, Midazolam and Dexamethasone  Airway Management Planned: Oral ETT  Additional Equipment: None  Intra-op Plan:   Post-operative Plan: Extubation in OR  Informed Consent: I have reviewed the patients History and Physical, chart, labs and discussed the procedure including the risks, benefits and alternatives for the proposed anesthesia with the patient or authorized representative who has indicated his/her understanding and acceptance.     Dental advisory given  Plan Discussed with: CRNA and Anesthesiologist  Anesthesia Plan Comments:       Anesthesia Quick Evaluation

## 2019-03-06 NOTE — Transfer of Care (Signed)
Immediate Anesthesia Transfer of Care Note  Patient: Cindy Deleon  Procedure(s) Performed: XI ROBOTIC ASSISTED RIGHT  INGUINAL HERNIA REPAIR WITH MESH, AND UMBILICAL HERNIA REPAIR (N/A Abdomen)  Patient Location: PACU  Anesthesia Type:General  Level of Consciousness: awake, alert  and patient cooperative  Airway & Oxygen Therapy: Patient Spontanous Breathing and Patient connected to face mask oxygen  Post-op Assessment: Report given to RN and Post -op Vital signs reviewed and stable  Post vital signs: Reviewed and stable  Last Vitals:  Vitals Value Taken Time  BP 116/77 03/06/19 1634  Temp    Pulse 83 03/06/19 1637  Resp 15 03/06/19 1637  SpO2 100 % 03/06/19 1637  Vitals shown include unvalidated device data.  Last Pain:  Vitals:   03/06/19 1315  TempSrc: Oral         Complications: No apparent anesthesia complications

## 2019-03-07 ENCOUNTER — Encounter (HOSPITAL_COMMUNITY): Payer: Self-pay | Admitting: Surgery

## 2019-12-28 ENCOUNTER — Other Ambulatory Visit: Payer: 59

## 2019-12-28 ENCOUNTER — Other Ambulatory Visit: Payer: Self-pay | Admitting: *Deleted

## 2019-12-28 ENCOUNTER — Other Ambulatory Visit: Payer: Self-pay

## 2019-12-28 DIAGNOSIS — Z20822 Contact with and (suspected) exposure to covid-19: Secondary | ICD-10-CM

## 2019-12-29 LAB — NOVEL CORONAVIRUS, NAA: SARS-CoV-2, NAA: NOT DETECTED

## 2019-12-29 LAB — SARS-COV-2, NAA 2 DAY TAT

## 2021-03-11 ENCOUNTER — Other Ambulatory Visit: Payer: Self-pay | Admitting: Obstetrics & Gynecology

## 2021-03-11 DIAGNOSIS — R928 Other abnormal and inconclusive findings on diagnostic imaging of breast: Secondary | ICD-10-CM

## 2021-03-12 ENCOUNTER — Ambulatory Visit
Admission: RE | Admit: 2021-03-12 | Discharge: 2021-03-12 | Disposition: A | Discharge status: Home / Self Care | Payer: 59 | Source: Ambulatory Visit | Attending: Obstetrics & Gynecology | Admitting: Obstetrics & Gynecology

## 2021-03-12 DIAGNOSIS — R928 Other abnormal and inconclusive findings on diagnostic imaging of breast: Secondary | ICD-10-CM
